# Patient Record
Sex: Female | Born: 1985
Health system: Southern US, Community
[De-identification: ages and names within clinical notes are randomized; demographics above are authoritative.]

## PROBLEM LIST (undated history)

## (undated) DIAGNOSIS — D649 Anemia, unspecified: Secondary | ICD-10-CM

## (undated) HISTORY — PX: TONSILLECTOMY: SUR1361

## (undated) HISTORY — DX: Anemia, unspecified: D64.9

---
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
## 2008-04-07 ENCOUNTER — Emergency Department (HOSPITAL_COMMUNITY): Admission: EM | Admit: 2008-04-07 | Discharge: 2008-04-07 | Payer: Self-pay | Admitting: Emergency Medicine

## 2009-01-26 ENCOUNTER — Emergency Department (HOSPITAL_COMMUNITY): Admission: EM | Admit: 2009-01-26 | Discharge: 2009-01-26 | Payer: Self-pay | Admitting: Emergency Medicine

## 2010-03-31 ENCOUNTER — Other Ambulatory Visit: Admission: RE | Admit: 2010-03-31 | Discharge: 2010-03-31 | Payer: Self-pay | Admitting: Obstetrics and Gynecology

## 2010-08-12 ENCOUNTER — Encounter (HOSPITAL_COMMUNITY)
Admission: RE | Admit: 2010-08-12 | Discharge: 2010-08-12 | Disposition: A | Discharge status: Home / Self Care | Payer: Medicaid Other | Source: Ambulatory Visit | Attending: Obstetrics & Gynecology | Admitting: Obstetrics & Gynecology

## 2010-08-12 DIAGNOSIS — Z01812 Encounter for preprocedural laboratory examination: Secondary | ICD-10-CM | POA: Insufficient documentation

## 2010-08-12 LAB — SURGICAL PCR SCREEN
MRSA, PCR: NEGATIVE
Staphylococcus aureus: NEGATIVE

## 2010-08-12 LAB — URINALYSIS, ROUTINE W REFLEX MICROSCOPIC
Bilirubin Urine: NEGATIVE
Hgb urine dipstick: NEGATIVE
Ketones, ur: NEGATIVE mg/dL
Nitrite: NEGATIVE
Protein, ur: NEGATIVE mg/dL
Specific Gravity, Urine: 1.025 (ref 1.005–1.030)
Urine Glucose, Fasting: NEGATIVE mg/dL
Urobilinogen, UA: 0.2 mg/dL (ref 0.0–1.0)
pH: 6.5 (ref 5.0–8.0)

## 2010-08-12 LAB — CBC
HCT: 36.5 % (ref 36.0–46.0)
Hemoglobin: 12 g/dL (ref 12.0–15.0)
MCH: 30.6 pg (ref 26.0–34.0)
MCHC: 32.9 g/dL (ref 30.0–36.0)
MCV: 93.1 fL (ref 78.0–100.0)
Platelets: 203 10*3/uL (ref 150–400)
RBC: 3.92 MIL/uL (ref 3.87–5.11)
RDW: 13.1 % (ref 11.5–15.5)
WBC: 11.2 10*3/uL — ABNORMAL HIGH (ref 4.0–10.5)

## 2010-08-12 LAB — RPR: RPR Ser Ql: NONREACTIVE

## 2010-08-16 ENCOUNTER — Inpatient Hospital Stay (HOSPITAL_COMMUNITY)
Admission: RE | Admit: 2010-08-16 | Discharge: 2010-08-18 | DRG: 766 | Disposition: A | Discharge status: Home / Self Care | Payer: Medicaid Other | Source: Ambulatory Visit | Attending: Obstetrics & Gynecology | Admitting: Obstetrics & Gynecology

## 2010-08-16 DIAGNOSIS — O34219 Maternal care for unspecified type scar from previous cesarean delivery: Principal | ICD-10-CM | POA: Diagnosis present

## 2010-08-16 LAB — TYPE AND SCREEN
ABO/RH(D): A NEG
Antibody Screen: POSITIVE
DAT, IgG: NEGATIVE

## 2010-08-17 LAB — CBC
HCT: 26.7 % — ABNORMAL LOW (ref 36.0–46.0)
Hemoglobin: 8.9 g/dL — ABNORMAL LOW (ref 12.0–15.0)
MCH: 31 pg (ref 26.0–34.0)
MCHC: 33.3 g/dL (ref 30.0–36.0)
MCV: 93 fL (ref 78.0–100.0)
Platelets: 162 10*3/uL (ref 150–400)
RDW: 13.2 % (ref 11.5–15.5)
WBC: 11.3 10*3/uL — ABNORMAL HIGH (ref 4.0–10.5)

## 2010-08-19 NOTE — Op Note (Addendum)
Beverly Leon, Beverly Leon NO.:  [id number]  MEDICAL RECORD NO.:  [id number]           PATIENT TYPE:  I  LOCATION:  9118                          FACILITY:  [facility]  PHYSICIAN:  Lazaro Arms, M.D.   DATE OF BIRTH:  1986-04-20  DATE OF PROCEDURE:  08/16/2010 DATE OF DISCHARGE:                              OPERATIVE REPORT   PREOPERATIVE DIAGNOSIS:  Repeat cesarean section of a gravida 3, para 2- 0-0-3 at 21 and [redacted] weeks gestation, this is her third cesarean section.  POSTOPERATIVE DIAGNOSIS:  Repeat cesarean section of a gravida 3, para 2- 0-0-3 at 14 and [redacted] weeks gestation, this is her third cesarean section.  PROCEDURE:  Repeat low transverse cesarean section by Pfannenstiel incision.  SURGEONS: 1. Lazaro Arms, MD 2. Lucina Mellow, DO  ANESTHESIA:  Spinal.  FINDINGS:  A viable infant female in the vertex presentation, Apgars of 8 and 9 with normal uterus, tubes, ovaries, and placenta.  Placenta was the only specimen sent to labor and delivery.  ESTIMATED BLOOD LOSS:  850.  IV FLUIDS:  3000.  URINE OUTPUT:  200.  COMPLICATIONS:  None.  INDICATIONS:  This is a 25 year old gravida 3, para 2-0-0-3 at 9 and 2 weeks' gestation with a history of 2 prior C-sections and desired a need for a repeat cesarean section.  FINDINGS:  As above.  PROCEDURE IN DETAIL:  The patient was taken to the operating room where spinal anesthesia was found to be adequate.  She was then prepared and draped in the normal sterile fashion in the dorsal supine position with a leftward tilt.  A Pfannenstiel skin incision was then made with the scalpel and carried through to the underlying fascia with the Bovie. The fascia was incised in the midline and the incision was extended laterally with Mayo scissors.  Superior aspect of the fascial incision was then grasped with the Kocher clamps, elevated, and the underlying rectus muscles were dissected off bluntly.  Attention was then  turned to the inferior aspect of the incision and in similar fashion was grasped, tented up with Kocher clamps and the rectus muscles dissected off bluntly.  The rectus muscles were then separated in the midline.  The peritoneum was identified and entered.  The peritoneal incision was then extended superiorly and inferiorly with traction and good visualization of the bladder.  The bladder blade was inserted and the vesicouterine peritoneum was identified.  A bladder flap was not created.  The lower uterine segment was incised in a transverse fashion with the scalpel.  The uterine incision was then extended with traction.  The bladder blade was removed and the infant's head was delivered atraumatically.  The nose and mouth were suctioned with the bulb suction and the cord was clamped and cut at this time and 2 grams of cefotetan were given.  The infant was handed off to the waiting pediatrician. Cord blood sample was obtained.  The placenta was then removed manually.  The uterus was exteriorized and cleared of all clots and debris.  The uterine incision was repaired with 2-0 Vicryl in a running locked fashion.  The  second layer was not necessary to obtain hemostasis.  The uterus was inspected, normal tubes and ovaries were noted.  The gutters were cleared of all clots and the peritoneum was irrigated and drained.  The peritoneum was closed with 2 interrupted stitches of 0-chromic.  The fascia was reapproximated with 0- Vicryl in a running fashion.  The skin was closed with subcuticulars on a acute needle.  The patient tolerated the procedure well.  Sponge, lap, and needle counts were correct x3.  As noted, the 2 grams of cefotetan were given at the cord clamp.  The patient was taken to the recovery room in stable condition and the infant was in the nursery at the time of this dictation.    ______________________________ Lucina Mellow, DO   ______________________________ Lazaro Arms, M.D.    SH/MEDQ  D:  08/16/2010  T:  08/17/2010  Job:  [id number]  Electronically Signed by Lucina Mellow MD on 09/10/2010 08:15:50 PM Electronically Signed by Duane Lope M.D. on 09/16/2010 07:43:04 AM

## 2010-09-06 NOTE — Discharge Summary (Signed)
  Beverly Leon, KAGEL NO.:  [id number]  MEDICAL RECORD NO.:  [id number]           PATIENT TYPE:  LOCATION:                                 FACILITY:  PHYSICIAN:  Maryelizabeth Kaufmann, MD  DATE OF BIRTH:  September 09, 1985  DATE OF ADMISSION:  08/16/2010 DATE OF DISCHARGE:  08/18/2010                              DISCHARGE SUMMARY   ADMISSION DIAGNOSES: 1. Intrauterine pregnancy at 39 weeks and 3 days, previous C-section     x2.  DISCHARGE DIAGNOSIS: 1. Status post repeat low transverse cesarean section via Pfannenstiel     incision.  ATTENDING PHYSICIAN:  Scheryl Darter, MD.  FELLOW:  Maryelizabeth Kaufmann, MD.  DISCHARGE MEDICATIONS: 1. Ibuprofen 600 mg 1 tab p.o. q.6 h. as needed. 2. Percocet 5/325, 1 tab p.o. q.4 h. as needed. 3. Ferrous sulfate 325, 1 tab p.o. b.i.d. 4. Colace 100 mg 1 tab p.o. b.i.d. 5. The patient did receive Depo-Provera 150 mg IM prior to discharge.  OPERATIONS:  Repeat low transverse cesarean section on August 16, 2010 with delivery of a viable female infant in vertex presentation.  Apgar's were 8 and 9.  Weight was 7 pounds 13 ounces.  PERTINENT LABS:  Initial hemoglobin preop was 12.0 and 36.5. Postoperative hemoglobin was 8.9 and 26.7.  She was otherwise hemodynamically stable and platelets were normal.  HOSPITAL COURSE:  This is a 25 year old gravida 3, para 2-0-0-3 who presented on admission with previous C-section x2 desiring repeat C- section.  The patient underwent a repeat C-section on the 13th.  Her postoperative course was benign.  She was otherwise hemodynamically stable and afebrile.  She is ambulating well without any acute complaints.  She was discharged home on postoperative day #2 in stable condition.  DISPOSITION:  Discharged to home.  DISCHARGE CONDITION:  Stable.  FOLLOWUP:  The patient is to follow up with [facility] in 2 weeks for postoperative wound check and further evaluation.  The patient is to have an  IUD at 6 weeks postpartum check per report.  ER WARNINGS:  The patient is to return to the emergency department with any fevers, chills, nausea, vomiting, any incisional problems such as redness, swelling, discharge, any opening or dehiscence or other concerning symptoms.          ______________________________ Maryelizabeth Kaufmann, MD     LC/MEDQ  D:  08/18/2010  T:  08/18/2010  Job:  [id number]  Electronically Signed by Maryelizabeth Kaufmann MD on 09/06/2010 09:51:05 AM

## 2010-11-19 NOTE — Op Note (Signed)
NAMETEIGHAN, Beverly Leon              ACCOUNT NO.:  [id number]   MEDICAL RECORD NO.:  [id number]          PATIENT TYPE:  INP   LOCATION:  A413                          FACILITY:  [facility]   PHYSICIAN:  Lazaro Arms, M.D.   DATE OF BIRTH:  1986-01-14   DATE OF PROCEDURE:  06/11/2004  DATE OF DISCHARGE:                                 OPERATIVE REPORT   PREOPERATIVE DIAGNOSES:  1.  Intrauterine pregnancy at [redacted] weeks gestation.  2.  Previous Cesarean section.   POSTOPERATIVE DIAGNOSES:  1.  Intrauterine pregnancy at [redacted] weeks gestation.  2.  Previous Cesarean section.   PROCEDURE:  Repeat Cesarean section.   SURGEON:  Dr. Despina Hidden.   ANESTHESIA:  Spinal.   FINDINGS:  Over a low transverse hysterotomy incision was delivered a viable  female infant at 35 with Apgars of 8 and 10 with weight determined in the  nursery. There was three-vessel cord. Cord blood and cord gases were sent.  Placenta was normal and intact. Uterus, tubes, and ovaries were normal.   DESCRIPTION OF OPERATION:  The patient was taken to the operating room,  placed in the sitting position where she underwent a spinal anesthetic. She  was then placed in supine position with a roll under her right hip. She was  prepped and draped in the usual sterile fashion. A Pfannenstiel skin  incision was made and carried sharply to the rectus fascia which was scored  in the midline and extended laterally. The fascia was taken off the muscles  superiorly and inferiorly without difficulty. The muscles were divided.  Peritoneal cavity was entered, and bladder blade was placed. The  vesicouterine serosal flap was created. Low transverse hysterotomy incision  was made, and over this incision was delivered a viable female infant at  57 with Apgars of 8 and 10 with weight determined in the nursery. The  infant was handed to Dr. Milinda Cave who was in attendance for routine neonatal  resuscitation. Cord blood and cord gases were sent. Placenta  was normal and  delivered spontaneously. The uterus was exteriorized and wiped clean with a  clean lap pad. It was closed in 2 layers, first being a running interlocking  layer, the second being imbricating layer. There was good hemostasis. The  uterus was replaced in the peritoneal cavity. The pelvis was irrigated  vigorously and all pedicles hemostatic. The muscles and peritoneum were  reapproximated loosely with 0  chromic, the fascia closed using a 0 Vicryl running. Subcutaneous tissue was  made hemostatic and irrigated. Skin was closed using skin staples. The  patient tolerated the procedure. She experienced 700 cc of blood loss. Was  taken to the recovery room in good, stable condition. All counts correct x3.     Luth   LHE/MEDQ  D:  06/11/2004  T:  06/11/2004  Job:  [id number]

## 2010-11-19 NOTE — Discharge Summary (Signed)
Beverly Leon, Beverly Leon              ACCOUNT NO.:  [id number]   MEDICAL RECORD NO.:  [id number]          PATIENT TYPE:  INP   LOCATION:  A413                          FACILITY:  [facility]   PHYSICIAN:  Tilda Burrow, M.D. DATE OF BIRTH:  07/29/1985   DATE OF ADMISSION:  06/11/2004  DATE OF DISCHARGE:  12/11/2005LH                                 DISCHARGE SUMMARY   ADMISSION DIAGNOSES:  1.  Pregnancy at 39 weeks.  2.  Prior cesarean section, not for trial of labor.   DISCHARGE DIAGNOSES:  1.  Pregnancy at 39 weeks, delivered.  2.  Prior cesarean section not for trial of labor.   PROCEDURE:  On June 11, 2004, repeat low transverse cervical cesarean  section by Dr. Turner Daniels.   DISCHARGE MEDICATIONS:  1.  Darvocet-N 100 #30 tablets one to two q.4h. p.r.n. pain.  2.  Motrin 800 mg one q.8h. p.r.n. cramps.  3.  Depo-Provera 150 mg to be administered at follow up visit at [facility]      OB/GYN.   HOSPITAL COURSE:  This 25 year old female, gravida 2, para 1-0-0-2, (_she  had_twins cesarean section delivered in January 2005), is admitted for  repeat cesarean section.  She has had no problems this pregnancy.   The patient was admitted, underwent repeat low transverse cervical cesarean  section on June 11, 2004, with 750 cc estimated blood loss.  Admission  hemoglobin was 11.1, hematocrit 31.7.  Maternal blood type is A negative.  Repeat cesarean section was uneventful other than the increased blood loss.  Delivered a healthy female infant with Apgar's of 9 and 9.   Postpartum course was straightforward.  Hematocrit returned to 23.5,  hemoglobin 8.3, on postoperative day #1.  She was afebrile, tolerating a  regular diet, tolerating the anemia quite well, incision looked great  without erythema or swelling.  She was stable for discharge, tolerating the  discomfort with minimal pain medication needs.  Followup will be five days  for Depo-Provera and staple removal.  The patient is  considering  progesterone-containing IUD as long-term contraception.     John   JVF/MEDQ  D:  06/13/2004  T:  06/13/2004  Job:  [id number]

## 2010-11-22 ENCOUNTER — Other Ambulatory Visit (HOSPITAL_COMMUNITY)
Admission: RE | Admit: 2010-11-22 | Discharge: 2010-11-22 | Disposition: A | Discharge status: Home / Self Care | Payer: Medicaid Other | Source: Ambulatory Visit | Attending: Obstetrics and Gynecology | Admitting: Obstetrics and Gynecology

## 2010-11-22 DIAGNOSIS — Z01419 Encounter for gynecological examination (general) (routine) without abnormal findings: Secondary | ICD-10-CM | POA: Insufficient documentation

## 2010-11-22 DIAGNOSIS — Z113 Encounter for screening for infections with a predominantly sexual mode of transmission: Secondary | ICD-10-CM | POA: Insufficient documentation

## 2012-10-24 DIAGNOSIS — Z029 Encounter for administrative examinations, unspecified: Secondary | ICD-10-CM

## 2015-04-26 ENCOUNTER — Encounter (HOSPITAL_COMMUNITY): Payer: Self-pay | Admitting: Emergency Medicine

## 2015-04-26 ENCOUNTER — Emergency Department (HOSPITAL_COMMUNITY)
Admission: EM | Admit: 2015-04-26 | Discharge: 2015-04-26 | Discharge status: Against Medical Advice | Payer: Medicaid Other | Attending: Emergency Medicine | Admitting: Emergency Medicine

## 2015-04-26 DIAGNOSIS — Z008 Encounter for other general examination: Secondary | ICD-10-CM | POA: Insufficient documentation

## 2015-04-26 DIAGNOSIS — F17210 Nicotine dependence, cigarettes, uncomplicated: Secondary | ICD-10-CM | POA: Insufficient documentation

## 2015-04-26 LAB — BASIC METABOLIC PANEL
ANION GAP: 9 (ref 5–15)
BUN: 11 mg/dL (ref 6–20)
CO2: 28 mmol/L (ref 22–32)
Calcium: 9.6 mg/dL (ref 8.9–10.3)
Chloride: 104 mmol/L (ref 101–111)
Creatinine, Ser: 0.62 mg/dL (ref 0.44–1.00)
GFR calc Af Amer: >60 mL/min (ref >60)
GFR calc non Af Amer: >60 mL/min (ref >60)
Glucose, Bld: 87 mg/dL (ref 65–99)
Potassium: 4.4 mmol/L (ref 3.5–5.1)
Sodium: 141 mmol/L (ref 135–145)

## 2015-04-26 LAB — RAPID URINE DRUG SCREEN, HOSP PERFORMED
Amphetamines: NOT DETECTED
Barbiturates: NOT DETECTED
Benzodiazepines: POSITIVE — AB
Cocaine: NOT DETECTED
OPIATES: POSITIVE — AB
Tetrahydrocannabinol: NOT DETECTED

## 2015-04-26 LAB — CBC WITH DIFFERENTIAL/PLATELET
BASOS PCT: 0 %
Basophils Absolute: 0 10*3/uL (ref 0.0–0.1)
Eosinophils Absolute: 0.1 10*3/uL (ref 0.0–0.7)
Eosinophils Relative: 2 %
HCT: 42.2 % (ref 36.0–46.0)
Hemoglobin: 14.3 g/dL (ref 12.0–15.0)
Lymphocytes Relative: 22 %
Lymphs Abs: 1.6 10*3/uL (ref 0.7–4.0)
MCH: 30.7 pg (ref 26.0–34.0)
MCHC: 33.9 g/dL (ref 30.0–36.0)
MCV: 90.6 fL (ref 78.0–100.0)
MONOS PCT: 7 %
Monocytes Absolute: 0.5 10*3/uL (ref 0.1–1.0)
Neutro Abs: 5 10*3/uL (ref 1.7–7.7)
Neutrophils Relative %: 69 %
Platelets: 264 10*3/uL (ref 150–400)
RBC: 4.66 MIL/uL (ref 3.87–5.11)
RDW: 13.5 % (ref 11.5–15.5)
WBC: 7.2 10*3/uL (ref 4.0–10.5)

## 2015-04-26 LAB — ETHANOL: Alcohol, Ethyl (B): <5 mg/dL (ref <5)

## 2015-04-26 NOTE — ED Notes (Signed)
Patient states she has been using heroin since November. States she needs help with rehabilitation. Patient complaining of chills and sweating starting this morning. States last use was yesterday.

## 2015-04-26 NOTE — ED Notes (Signed)
Informed by staff that pt and family left AMA

## 2016-05-16 ENCOUNTER — Emergency Department (HOSPITAL_COMMUNITY): Payer: No Typology Code available for payment source

## 2016-05-16 ENCOUNTER — Emergency Department (HOSPITAL_COMMUNITY)
Admission: EM | Admit: 2016-05-16 | Discharge: 2016-05-16 | Disposition: A | Discharge status: Home / Self Care | Payer: No Typology Code available for payment source | Attending: Emergency Medicine | Admitting: Emergency Medicine

## 2016-05-16 ENCOUNTER — Encounter (HOSPITAL_COMMUNITY): Payer: Self-pay | Admitting: Emergency Medicine

## 2016-05-16 DIAGNOSIS — Y939 Activity, unspecified: Secondary | ICD-10-CM | POA: Diagnosis not present

## 2016-05-16 DIAGNOSIS — F17210 Nicotine dependence, cigarettes, uncomplicated: Secondary | ICD-10-CM | POA: Insufficient documentation

## 2016-05-16 DIAGNOSIS — L538 Other specified erythematous conditions: Secondary | ICD-10-CM | POA: Diagnosis not present

## 2016-05-16 DIAGNOSIS — Y990 Civilian activity done for income or pay: Secondary | ICD-10-CM | POA: Diagnosis not present

## 2016-05-16 DIAGNOSIS — R51 Headache: Secondary | ICD-10-CM | POA: Diagnosis not present

## 2016-05-16 DIAGNOSIS — Y92410 Unspecified street and highway as the place of occurrence of the external cause: Secondary | ICD-10-CM | POA: Insufficient documentation

## 2016-05-16 DIAGNOSIS — S4992XA Unspecified injury of left shoulder and upper arm, initial encounter: Secondary | ICD-10-CM | POA: Diagnosis not present
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
## 2016-05-16 NOTE — Discharge Instructions (Signed)
Take tylenol or ibuprofen every 6-8 hours as needed for neck pain and headache Mild stretches, heating may also be helpful Return to ED if you develop severe sudden headache, changes in vision, neck stiffness or pain that do not respond to tylenol or ibuprofen
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
## 2018-03-08 ENCOUNTER — Encounter: Payer: Self-pay | Admitting: Adult Health

## 2018-03-08 ENCOUNTER — Ambulatory Visit (INDEPENDENT_AMBULATORY_CARE_PROVIDER_SITE_OTHER): Payer: BLUE CROSS/BLUE SHIELD | Admitting: Adult Health

## 2018-03-08 ENCOUNTER — Other Ambulatory Visit (HOSPITAL_COMMUNITY)
Admission: RE | Admit: 2018-03-08 | Discharge: 2018-03-08 | Disposition: A | Discharge status: Home / Self Care | Payer: BLUE CROSS/BLUE SHIELD | Source: Ambulatory Visit | Attending: Adult Health | Admitting: Adult Health

## 2018-03-08 VITALS — BP 123/75 | HR 83 | Ht 63.75 in | Wt 151.5 lb

## 2018-03-08 DIAGNOSIS — Z01419 Encounter for gynecological examination (general) (routine) without abnormal findings: Secondary | ICD-10-CM | POA: Diagnosis not present

## 2018-03-08 DIAGNOSIS — Z1151 Encounter for screening for human papillomavirus (HPV): Secondary | ICD-10-CM | POA: Diagnosis not present

## 2018-03-08 DIAGNOSIS — Z113 Encounter for screening for infections with a predominantly sexual mode of transmission: Secondary | ICD-10-CM | POA: Insufficient documentation

## 2018-03-08 DIAGNOSIS — Z3009 Encounter for other general counseling and advice on contraception: Secondary | ICD-10-CM | POA: Insufficient documentation

## 2018-03-08 DIAGNOSIS — Z3202 Encounter for pregnancy test, result negative: Secondary | ICD-10-CM | POA: Insufficient documentation

## 2018-03-08 LAB — POCT URINE PREGNANCY: Preg Test, Ur: NEGATIVE
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
## 2018-03-09 LAB — CYTOLOGY - PAP
Adequacy: ABSENT
Chlamydia: NEGATIVE
Diagnosis: NEGATIVE
HPV: NOT DETECTED
Neisseria Gonorrhea: NEGATIVE

## 2018-03-09 LAB — HEPATITIS C ANTIBODY: Hep C Virus Ab: <0.1 s/co ratio (ref 0.0–0.9)

## 2018-03-09 LAB — RPR: RPR Ser Ql: NONREACTIVE

## 2018-03-09 LAB — HIV ANTIBODY (ROUTINE TESTING W REFLEX): HIV Screen 4th Generation wRfx: NONREACTIVE

## 2018-03-26 ENCOUNTER — Encounter: Payer: Self-pay | Admitting: Adult Health

## 2018-03-26 ENCOUNTER — Ambulatory Visit (INDEPENDENT_AMBULATORY_CARE_PROVIDER_SITE_OTHER): Payer: BLUE CROSS/BLUE SHIELD | Admitting: Adult Health

## 2018-03-26 VITALS — BP 107/62 | HR 75 | Ht 65.0 in | Wt 152.0 lb

## 2018-03-26 DIAGNOSIS — Z3046 Encounter for surveillance of implantable subdermal contraceptive: Secondary | ICD-10-CM

## 2018-03-26 DIAGNOSIS — Z3202 Encounter for pregnancy test, result negative: Secondary | ICD-10-CM | POA: Diagnosis not present

## 2018-03-26 DIAGNOSIS — Z30017 Encounter for initial prescription of implantable subdermal contraceptive: Secondary | ICD-10-CM

## 2018-03-26 LAB — POCT URINE PREGNANCY: Preg Test, Ur: NEGATIVE

## 2018-03-26 MED ORDER — ETONOGESTREL 68 MG ~~LOC~~ IMPL
68.0000 mg | DRUG_IMPLANT | Freq: Once | SUBCUTANEOUS | Status: AC
  Administered 2018-03-26: 68 mg via SUBCUTANEOUS

## 2018-03-26 NOTE — Patient Instructions (Signed)
Use condoms x 2 weeks, keep clean and dry x 24 hours, no heavy lifting, keep steri strips on x 72 hours, Keep pressure dressing on x 24 hours. Follow up prn problems.  

## 2018-03-26 NOTE — Progress Notes (Signed)
  Subjective:     Patient ID: Beverly Leon, female   DOB: 07/07/1985, 32 y.o.   MRN: [id number]  HPI Beverly Leon is a 32 year old white female in for nexplanon insertion.  Review of Systems For nexplanon insertion Reviewed past medical,surgical, social and family history. Reviewed medications and allergies.     Objective:   Physical Exam BP 107/62 (BP Location: Right Arm, Patient Position: Sitting, Cuff Size: Normal)   Pulse 75   Ht 5\' 5"  (1.651 m)   Wt 152 lb (68.9 kg)   LMP 03/21/2018   BMI 25.29 kg/m UPT negative. Consent signed, time out called. Left arm cleansed with betadine, and injected with 1.5 cc 1% lidocaine and waited til numb. Nexplanon easily inserted and steri strips applied.Rod easily palpated by provider and pt. Pressure dressing applied.    Assessment:     1. Nexplanon insertion   2. Pregnancy examination or test, negative result   LOT [id number] Exp 05/15/20    Plan:     Use condoms x 2 weeks, keep clean and dry x 24 hours, no heavy lifting, keep steri strips on x 72 hours, Keep pressure dressing on x 24 hours. Follow up prn problems.Removal in 3 years or sooner if desired.

## 2022-06-15 ENCOUNTER — Ambulatory Visit: Payer: 59 | Admitting: Obstetrics & Gynecology

## 2022-10-19 ENCOUNTER — Other Ambulatory Visit (HOSPITAL_COMMUNITY)
Admission: RE | Admit: 2022-10-19 | Discharge: 2022-10-19 | Disposition: A | Discharge status: Home / Self Care | Payer: BC Managed Care – PPO | Source: Ambulatory Visit | Attending: Adult Health | Admitting: Adult Health

## 2022-10-19 ENCOUNTER — Ambulatory Visit: Payer: BC Managed Care – PPO | Admitting: Adult Health

## 2022-10-19 ENCOUNTER — Encounter: Payer: Self-pay | Admitting: Adult Health

## 2022-10-19 VITALS — BP 130/84 | HR 74 | Ht 66.0 in | Wt 154.5 lb

## 2022-10-19 DIAGNOSIS — B9689 Other specified bacterial agents as the cause of diseases classified elsewhere: Secondary | ICD-10-CM | POA: Diagnosis not present

## 2022-10-19 DIAGNOSIS — Z01419 Encounter for gynecological examination (general) (routine) without abnormal findings: Secondary | ICD-10-CM

## 2022-10-19 DIAGNOSIS — K5900 Constipation, unspecified: Secondary | ICD-10-CM | POA: Insufficient documentation

## 2022-10-19 DIAGNOSIS — N898 Other specified noninflammatory disorders of vagina: Secondary | ICD-10-CM | POA: Diagnosis not present

## 2022-10-19 DIAGNOSIS — K649 Unspecified hemorrhoids: Secondary | ICD-10-CM | POA: Diagnosis not present

## 2022-10-19 DIAGNOSIS — Z9189 Other specified personal risk factors, not elsewhere classified: Secondary | ICD-10-CM | POA: Insufficient documentation

## 2022-10-19 DIAGNOSIS — Z1329 Encounter for screening for other suspected endocrine disorder: Secondary | ICD-10-CM | POA: Insufficient documentation

## 2022-10-19 DIAGNOSIS — Z13220 Encounter for screening for lipoid disorders: Secondary | ICD-10-CM | POA: Insufficient documentation

## 2022-10-19 LAB — POCT WET PREP (WET MOUNT)
Clue Cells Wet Prep Whiff POC: POSITIVE
WBC, Wet Prep HPF POC: POSITIVE

## 2022-10-19 MED ORDER — METRONIDAZOLE 500 MG PO TABS: 500.0000 mg | ORAL_TABLET | Freq: Two times a day (BID) | ORAL | 0 refills | Status: DC

## 2022-10-19 NOTE — Progress Notes (Signed)
Patient ID: Beverly Leon, female   DOB: June 03, 1986, 37 y.o.   MRN: [id number] History of Present Illness: Beverly Leon is a 37 year old white female, single, [id number], in for a well woman gyn exam and pap. She has an expired nexplanon in place, too.    Current Medications, Allergies, Past Medical History, Past Surgical History, Family History and Social History were reviewed in [employer] record.     Review of Systems: Patient denies any headaches, hearing loss, fatigue, blurred vision, shortness of breath, chest pain, abdominal pain, problems with urination, or intercourse. No joint pain or mood swings.  Has constipation at times and has hemorrhoid. She is working at [employer], and goes to gym but lacks motivation at home   Physical Exam:BP 130/84 (BP Location: Left Arm, Patient Position: Sitting, Cuff Size: Normal)   Pulse 74   Ht  (1.676 m)   Wt 154 lb 8 oz (70.1 kg)   LMP 09/28/2022 (Approximate)   BMI 24.94 kg/m   General:  Well developed, well nourished, no acute distress Skin:  Warm and dry Neck:  Midline trachea, normal thyroid, good ROM, no lymphadenopathy Lungs; Clear to auscultation bilaterally Breast:  No dominant palpable mass, retraction, or nipple discharge Cardiovascular: Regular rate and rhythm Abdomen:  Soft, non tender, no hepatosplenomegaly Pelvic:  External genitalia is normal in appearance, no lesions.  The vagina is normal in appearance,has creany white discharge with odor. Urethra has no lesions or masses. The cervix is smooth, pap with GC/CHL and HR HPV genotyping performed.   Uterus is felt to be normal size, shape, and contour.  No adnexal masses or tenderness noted.Bladder is non tender, no masses felt.  Wet Prep was +WBC and clue cells Has external hemorrhoid  Extremities/musculoskeletal:  No swelling or varicosities noted, no clubbing or cyanosis Psych:  No mood changes, alert and cooperative,seems happy AA is 3 Fall risk is  low    10/19/2022    9:31 AM 03/08/2018    9:31 AM  Depression screen PHQ 2/9  Decreased Interest 0 0  Down, Depressed, Hopeless 0 0  PHQ - 2 Score 0 0  Altered sleeping 0   Tired, decreased energy 1   Change in appetite 0   Feeling bad or failure about yourself  1   Trouble concentrating 1   Moving slowly or fidgety/restless 0   Suicidal thoughts 0   PHQ-9 Score 3        10/19/2022    9:32 AM  GAD 7 : Generalized Anxiety Score  Nervous, Anxious, on Edge 0  Control/stop worrying 0  Worry too much - different things 0  Trouble relaxing 0  Restless 0  Easily annoyed or irritable 1  Afraid - awful might happen 0  Total GAD 7 Score 1      Upstream - 10/19/22 0914       Pregnancy Intention Screening   Does the patient want to become pregnant in the next year? No    Does the patient's partner want to become pregnant in the next year? No    Would the patient like to discuss contraceptive options today? No      Contraception Wrap Up   Current Method Female Condom    End Method Female Condom            Examination chaperoned by Gladys Damme NP student and she did a co exam.   Impression and Plan: 1. Encounter for gynecological examination with Papanicolaou smear  of cervix Pap sent Pap in 3 years if normal Will check labs  - Cytology - PAP( Beverly Leon) - CBC - Comprehensive metabolic panel - Lipid panel  Return in 2 weeks for nexplanon removal  2. Vaginal discharge +discharge with odor  3. Vaginal odor + odor  4. Hemorrhoids, unspecified hemorrhoid type Try preparation H or Anusol   5. Constipation, unspecified constipation type Can take colace or senokot, but also white grape juice or apple juice, grapes, apples with peeling, prune juice and increase water   6. Screening cholesterol level - Lipid panel  7. Screening for thyroid disorder - TSH + free T4  8. Lack of motivation  - TSH + free T4  9. BV (bacterial vaginosis) Will rx flagyl,no sex or  alcohol while taking Meds ordered this encounter  Medications   metroNIDAZOLE (FLAGYL) 500 MG tablet    Sig: Take 1 tablet (500 mg total) by mouth 2 (two) times daily.    Dispense:  14 tablet    Refill:  0    Order Specific Question:   Supervising Provider    Answer:   Duane Lope H [[id number]]

## 2022-10-26 LAB — CYTOLOGY - PAP
Chlamydia: NEGATIVE
Comment: NEGATIVE
Comment: NEGATIVE
Comment: NORMAL
Diagnosis: NEGATIVE
High risk HPV: NEGATIVE
Neisseria Gonorrhea: NEGATIVE

## 2022-11-02 ENCOUNTER — Encounter: Payer: Self-pay | Admitting: Adult Health

## 2022-11-02 ENCOUNTER — Ambulatory Visit: Payer: BC Managed Care – PPO | Admitting: Adult Health

## 2022-11-02 VITALS — BP 121/74 | HR 81 | Ht 66.0 in | Wt 154.0 lb

## 2022-11-02 DIAGNOSIS — Z3046 Encounter for surveillance of implantable subdermal contraceptive: Secondary | ICD-10-CM | POA: Diagnosis not present

## 2022-11-02 LAB — LIPID PANEL
Chol/HDL Ratio: 2.4 ratio (ref 0.0–4.4)
Cholesterol, Total: 161 mg/dL (ref 100–199)
HDL: 67 mg/dL (ref >39)
LDL Chol Calc (NIH): 84 mg/dL (ref 0–99)
Triglycerides: 49 mg/dL (ref 0–149)
VLDL Cholesterol Cal: 10 mg/dL (ref 5–40)

## 2022-11-02 LAB — CBC
Hematocrit: 35.8 % (ref 34.0–46.6)
Hemoglobin: 11.5 g/dL (ref 11.1–15.9)
MCH: 29 pg (ref 26.6–33.0)
MCHC: 32.1 g/dL (ref 31.5–35.7)
MCV: 90 fL (ref 79–97)
Platelets: 278 10*3/uL (ref 150–450)
RBC: 3.97 x10E6/uL (ref 3.77–5.28)
RDW: 13.5 % (ref 11.7–15.4)
WBC: 5.6 10*3/uL (ref 3.4–10.8)

## 2022-11-02 LAB — COMPREHENSIVE METABOLIC PANEL
ALT: 13 IU/L (ref 0–32)
AST: 17 IU/L (ref 0–40)
Albumin/Globulin Ratio: 1.6 (ref 1.2–2.2)
Albumin: 4.1 g/dL (ref 3.9–4.9)
Alkaline Phosphatase: 35 IU/L — ABNORMAL LOW (ref 44–121)
BUN/Creatinine Ratio: 23 (ref 9–23)
BUN: 14 mg/dL (ref 6–20)
Bilirubin Total: 0.3 mg/dL (ref 0.0–1.2)
CO2: 22 mmol/L (ref 20–29)
Calcium: 9.3 mg/dL (ref 8.7–10.2)
Chloride: 103 mmol/L (ref 96–106)
Creatinine, Ser: 0.62 mg/dL (ref 0.57–1.00)
Globulin, Total: 2.5 g/dL (ref 1.5–4.5)
Glucose: 86 mg/dL (ref 70–99)
Potassium: 4.8 mmol/L (ref 3.5–5.2)
Sodium: 139 mmol/L (ref 134–144)
Total Protein: 6.6 g/dL (ref 6.0–8.5)
eGFR: 118 mL/min/{1.73_m2} (ref >59)

## 2022-11-02 LAB — TSH+FREE T4
Free T4: 1.42 ng/dL (ref 0.82–1.77)
TSH: 1.41 u[IU]/mL (ref 0.450–4.500)

## 2022-11-02 NOTE — Patient Instructions (Signed)
Use condoms, keep clean and dry x 24 hours, no heavy lifting, keep steri strips on x 72 hours, Keep pressure dressing on x 24 hours. Follow up prn problems.  

## 2022-11-02 NOTE — Progress Notes (Signed)
  Subjective:     Patient ID: Beverly Leon, female   DOB: April 26, 1986, 37 y.o.   MRN: [id number]  HPI Beverly Leon is a 37 year old white female, single,[id number], in for nexplanon removal.      Component Value Date/Time   DIAGPAP  10/19/2022 0915    - Negative for intraepithelial lesion or malignancy (NILM)   DIAGPAP  03/08/2018 0000    NEGATIVE FOR INTRAEPITHELIAL LESIONS OR MALIGNANCY.   HPVHIGH Negative 10/19/2022 0915   ADEQPAP  10/19/2022 0915    Satisfactory for evaluation; transformation zone component PRESENT.   ADEQPAP  03/08/2018 0000    Satisfactory for evaluation  endocervical/transformation zone component ABSENT.    Review of Systems For nexplanon removal Reviewed past medical,surgical, social and family history. Reviewed medications and allergies.     Objective:   Physical Exam BP 121/74 (BP Location: Left Arm, Patient Position: Sitting, Cuff Size: Normal)   Pulse 81   Ht 5\' 6"  (1.676 m)   Wt 154 lb (69.9 kg)   LMP 10/22/2022 (Approximate)   BMI 24.86 kg/m   Consent signed and time out called.   Left arm cleansed with betadine, and injected with 1.5 cc 2% lidocaine and waited til numb.Under sterile technique a #11 blade was used to make small vertical incision, and a curved forceps was used to easily remove rod. Steri strips applied. Pressure dressing applied.    Upstream - 11/02/22 1114       Pregnancy Intention Screening   Does the patient want to become pregnant in the next year? No    Does the patient's partner want to become pregnant in the next year? No    Would the patient like to discuss contraceptive options today? No      Contraception Wrap Up   Current Method Abstinence    End Method Abstinence;Female Condom    Contraception Counseling Provided Yes    How was the end contraceptive method provided? N/A             Assessment:     1. Encounter for Nexplanon removal Use condoms, keep clean and dry x 24 hours, no heavy lifting, keep steri strips  on x 72 hours, Keep pressure dressing on x 24 hours. Follow up prn problems.     Plan:     Follow up prn

## 2023-02-08 ENCOUNTER — Ambulatory Visit (INDEPENDENT_AMBULATORY_CARE_PROVIDER_SITE_OTHER): Payer: BLUE CROSS/BLUE SHIELD | Admitting: *Deleted

## 2023-02-08 VITALS — BP 112/75 | HR 72 | Ht 64.0 in | Wt 164.0 lb

## 2023-02-08 DIAGNOSIS — Z3201 Encounter for pregnancy test, result positive: Secondary | ICD-10-CM | POA: Diagnosis not present

## 2023-02-08 DIAGNOSIS — Z789 Other specified health status: Secondary | ICD-10-CM

## 2023-02-08 LAB — POCT URINE PREGNANCY: Preg Test, Ur: POSITIVE — AB

## 2023-02-08 NOTE — Progress Notes (Signed)
   NURSE VISIT- PREGNANCY CONFIRMATION   SUBJECTIVE:  Beverly Leon is a 37 y.o. [phone] female by uncertain LMP of No LMP recorded (lmp unknown). Patient is pregnant. Had spotting 5/24 and 5/25. No bleeding in June or July. Here for pregnancy confirmation.  Home pregnancy test: positive x 2   She reports nausea.  She is taking prenatal vitamins.    OBJECTIVE:  BP 112/75 (BP Location: Left Arm, Patient Position: Sitting, Cuff Size: Normal)   Pulse 72   Ht 5\' 4"  (1.626 m)   Wt 164 lb (74.4 kg)   LMP  (LMP Unknown)   BMI 28.15 kg/m   Appears well, in no apparent distress  Results for orders placed or performed in visit on 02/08/23 (from the past 24 hour(s))  POCT urine pregnancy   Collection Time: 02/08/23  3:12 PM  Result Value Ref Range   Preg Test, Ur Positive (A) Negative    ASSESSMENT: Positive pregnancy test, Unknown by LMP    PLAN: HCG today Schedule for dating ultrasound in TBD based on HCG  Prenatal vitamins: continue   Nausea medicines: not currently needed   OB packet given: Yes  Jobe Marker  02/08/2023 3:13 PM

## 2023-02-22 ENCOUNTER — Other Ambulatory Visit: Payer: Self-pay

## 2023-02-22 ENCOUNTER — Other Ambulatory Visit: Payer: Self-pay | Admitting: General Practice

## 2023-02-22 ENCOUNTER — Ambulatory Visit (INDEPENDENT_AMBULATORY_CARE_PROVIDER_SITE_OTHER): Payer: BLUE CROSS/BLUE SHIELD

## 2023-02-22 DIAGNOSIS — Z3491 Encounter for supervision of normal pregnancy, unspecified, first trimester: Secondary | ICD-10-CM

## 2023-02-22 DIAGNOSIS — Z3A10 10 weeks gestation of pregnancy: Secondary | ICD-10-CM | POA: Diagnosis not present

## 2023-03-28 ENCOUNTER — Encounter: Payer: Self-pay | Admitting: Women's Health

## 2023-03-28 ENCOUNTER — Encounter: Payer: Medicaid Other | Admitting: *Deleted

## 2023-03-28 ENCOUNTER — Ambulatory Visit (INDEPENDENT_AMBULATORY_CARE_PROVIDER_SITE_OTHER): Payer: Medicaid Other | Admitting: Women's Health

## 2023-03-28 VITALS — BP 112/74 | HR 74 | Wt 167.0 lb

## 2023-03-28 DIAGNOSIS — Z1332 Encounter for screening for maternal depression: Secondary | ICD-10-CM

## 2023-03-28 DIAGNOSIS — Z363 Encounter for antenatal screening for malformations: Secondary | ICD-10-CM | POA: Diagnosis not present

## 2023-03-28 DIAGNOSIS — F17200 Nicotine dependence, unspecified, uncomplicated: Secondary | ICD-10-CM

## 2023-03-28 DIAGNOSIS — Z3482 Encounter for supervision of other normal pregnancy, second trimester: Secondary | ICD-10-CM

## 2023-03-28 DIAGNOSIS — O0932 Supervision of pregnancy with insufficient antenatal care, second trimester: Secondary | ICD-10-CM

## 2023-03-28 DIAGNOSIS — Z3A15 15 weeks gestation of pregnancy: Secondary | ICD-10-CM

## 2023-03-28 DIAGNOSIS — Z98891 History of uterine scar from previous surgery: Secondary | ICD-10-CM | POA: Diagnosis not present

## 2023-03-28 DIAGNOSIS — Z3480 Encounter for supervision of other normal pregnancy, unspecified trimester: Secondary | ICD-10-CM

## 2023-03-28 DIAGNOSIS — Z3490 Encounter for supervision of normal pregnancy, unspecified, unspecified trimester: Secondary | ICD-10-CM | POA: Insufficient documentation

## 2023-03-28 DIAGNOSIS — F1191 Opioid use, unspecified, in remission: Secondary | ICD-10-CM | POA: Insufficient documentation

## 2023-03-28 MED ORDER — BLOOD PRESSURE MONITOR MISC
0 refills | Status: DC
Start: 2023-03-28 — End: 2023-09-14

## 2023-03-28 NOTE — Patient Instructions (Addendum)
Beverly Leon, thank you for choosing our office today! We appreciate the opportunity to meet your healthcare needs. You may receive a short survey by mail, e-mail, or through [employer]. If you are happy with your care we would appreciate if you could take just a few minutes to complete the survey questions. We read all of your comments and take your feedback very seriously. Thank you again for choosing our office.  Center for [employer] [facility] Team at [facility]  [facility] at [facility] ([address] [place], [place] [address]) Entrance C, located off of [address] Free 24/7 valet parking   Nausea & Vomiting Have saltine crackers or pretzels by your bed and eat a few bites before you raise your head out of bed in the morning Eat small frequent meals throughout the day instead of large meals Drink plenty of fluids throughout the day to stay hydrated, just don't drink a lot of fluids with your meals.  This can make your stomach fill up faster making you feel sick Do not brush your teeth right after you eat Products with real ginger are good for nausea, like ginger ale and ginger hard candy Make sure it says made with real ginger! Sucking on sour candy like lemon heads is also good for nausea If your prenatal vitamins make you nauseated, take them at night so you will sleep through the nausea Sea Bands If you feel like you need medicine for the nausea & vomiting please let us know If you are unable to keep any fluids or food down please let us know   Constipation Drink plenty of fluid, preferably water, throughout the day Eat foods high in fiber such as fruits, vegetables, and grains Exercise, such as walking, is a good way to keep your bowels regular Drink warm fluids, especially warm prune juice, or decaf coffee Eat a 1/2 cup of real oatmeal (not instant), 1/2 cup applesauce, and 1/2-1 cup warm prune juice every day If needed, you may take Colace (docusate sodium) stool softener  once or twice a day to help keep the stool soft.  If you still are having problems with constipation, you may take Miralax once daily as needed to help keep your bowels regular.   Home Blood Pressure Monitoring for Patients   Your provider has recommended that you check your blood pressure (BP) at least once a week at home. If you do not have a blood pressure cuff at home, one will be provided for you. Contact your provider if you have not received your monitor within 1 week.   Helpful Tips for Accurate Home Blood Pressure Checks  Don't smoke, exercise, or drink caffeine 30 minutes before checking your BP Use the restroom before checking your BP (a full bladder can raise your pressure) Relax in a comfortable upright chair Feet on the ground Left arm resting comfortably on a flat surface at the level of your heart Legs uncrossed Back supported Sit quietly and don't talk Place the cuff on your bare arm Adjust snuggly, so that only two fingertips can fit between your skin and the top of the cuff Check 2 readings separated by at least one minute Keep a log of your BP readings For a visual, please reference this diagram: http://ccnc.care/bpdiagram  Provider Name: [facility]     Phone: [phone]  Zone 1: ALL CLEAR  Continue to monitor your symptoms:  BP reading is less than 140 (top number) or less than 90 (bottom  number)  No right upper stomach pain No headaches or seeing spots No feeling nauseated or throwing up No swelling in face and hands  Zone 2: CAUTION Call your doctor's office for any of the following:  BP reading is greater than 140 (top number) or greater than 90 (bottom number)  Stomach pain under your ribs in the middle or right side Headaches or seeing spots Feeling nauseated or throwing up Swelling in face and hands  Zone 3: EMERGENCY  Seek immediate medical care if you have any of the following:  BP reading is greater than160 (top number) or greater than  110 (bottom number) Severe headaches not improving with Tylenol Serious difficulty catching your breath Any worsening symptoms from Zone 2   Second Trimester of Pregnancy  The second trimester of pregnancy is from week 13 through week 27. This is months 4 through 6 of pregnancy. The second trimester is often a time when you feel your best. Your body has adjusted to being pregnant, and you begin to feel better physically. During the second trimester: Morning sickness has lessened or stopped completely. You may have more energy. You may have an increase in appetite. The second trimester is also a time when the unborn baby (fetus) is growing rapidly. At the end of the sixth month, the fetus may be up to 12 inches long and weigh about 1 pounds. You will likely begin to feel the baby move (quickening) between 16 and 20 weeks of pregnancy. Body changes during your second trimester Your body continues to go through many changes during your second trimester. The changes vary and generally return to normal after the baby is born. Physical changes Your weight will continue to increase. You will notice your lower abdomen bulging out. You may begin to get stretch marks on your hips, abdomen, and breasts. Your breasts will continue to grow and to become tender. Dark spots or blotches (chloasma or mask of pregnancy) may develop on your face. A dark line from your belly button to the pubic area (linea nigra) may appear. You may have changes in your hair. These can include thickening of your hair, rapid growth, and changes in texture. Some people also have hair loss during or after pregnancy, or hair that feels dry or thin. Health changes You may develop headaches. You may have heartburn. You may develop constipation. You may develop hemorrhoids or swollen, bulging veins (varicose veins). Your gums may bleed and may be sensitive to brushing and flossing. You may urinate more often because the fetus is  pressing on your bladder. You may have back pain. This is caused by: Weight gain. Pregnancy hormones that are relaxing the joints in your pelvis. A shift in weight and the muscles that support your balance. Follow these instructions at home: Medicines Follow your health care provider's instructions regarding medicine use. Specific medicines may be either safe or unsafe to take during pregnancy. Do not take any medicines unless approved by your health care provider. Take a prenatal vitamin that contains at least 600 micrograms (mcg) of folic acid. Eating and drinking Eat a healthy diet that includes fresh fruits and vegetables, whole grains, good sources of protein such as meat, eggs, or tofu, and low-fat dairy products. Avoid raw meat and unpasteurized juice, milk, and cheese. These carry germs that can harm you and your baby. You may need to take these actions to prevent or treat constipation: Drink enough fluid to keep your urine pale yellow. Eat foods that are high  in fiber, such as beans, whole grains, and fresh fruits and vegetables. Limit foods that are high in fat and processed sugars, such as fried or sweet foods. Activity Exercise only as directed by your health care provider. Most people can continue their usual exercise routine during pregnancy. Try to exercise for 30 minutes at least 5 days a week. Stop exercising if you develop contractions in your uterus. Stop exercising if you develop pain or cramping in the lower abdomen or lower back. Avoid exercising if it is very hot or humid or if you are at a high altitude. Avoid heavy lifting. If you choose to, you may have sex unless your health care provider tells you not to. Relieving pain and discomfort Wear a supportive bra to prevent discomfort from breast tenderness. Take warm sitz baths to soothe any pain or discomfort caused by hemorrhoids. Use hemorrhoid cream if your health care provider approves. Rest with your legs raised  (elevated) if you have leg cramps or low back pain. If you develop varicose veins: Wear support hose as told by your health care provider. Elevate your feet for 15 minutes, 3-4 times a day. Limit salt in your diet. Safety Wear your seat belt at all times when driving or riding in a car. Talk with your health care provider if someone is verbally or physically abusive to you. Lifestyle Do not use hot tubs, steam rooms, or saunas. Do not douche. Do not use tampons or scented sanitary pads. Avoid cat litter boxes and soil used by cats. These carry germs that can cause birth defects in the baby and possibly loss of the fetus by miscarriage or stillbirth. Do not use herbal remedies, alcohol, illegal drugs, or medicines that are not approved by your health care provider. Chemicals in these products can harm your baby. Do not use any products that contain nicotine or tobacco, such as cigarettes, e-cigarettes, and chewing tobacco. If you need help quitting, ask your health care provider. General instructions During a routine prenatal visit, your health care provider will do a physical exam and other tests. He or she will also discuss your overall health. Keep all follow-up visits. This is important. Ask your health care provider for a referral to a local prenatal education class. Ask for help if you have counseling or nutritional needs during pregnancy. Your health care provider can offer advice or refer you to specialists for help with various needs. Where to find more information American Pregnancy Association: americanpregnancy.org [employer] of Obstetricians and Gynecologists: https://www.todd-brady.net/ Office on [employer] Health: MightyReward.co.nz Contact a health care provider if you have: A headache that does not go away when you take medicine. Vision changes or you see spots in front of your eyes. Mild pelvic cramps, pelvic pressure, or nagging pain in the abdominal  area. Persistent nausea, vomiting, or diarrhea. A bad-smelling vaginal discharge or foul-smelling urine. Pain when you urinate. Sudden or extreme swelling of your face, hands, ankles, feet, or legs. A fever. Get help right away if you: Have fluid leaking from your vagina. Have spotting or bleeding from your vagina. Have severe abdominal cramping or pain. Have difficulty breathing. Have chest pain. Have fainting spells. Have not felt your baby move for the time period told by your health care provider. Have new or increased pain, swelling, or redness in an arm or leg. Summary The second trimester of pregnancy is from week 13 through week 27 (months 4 through 6). Do not use herbal remedies, alcohol, illegal drugs, or medicines  that are not approved by your health care provider. Chemicals in these products can harm your baby. Exercise only as directed by your health care provider. Most people can continue their usual exercise routine during pregnancy. Keep all follow-up visits. This is important. This information is not intended to replace advice given to you by your health care provider. Make sure you discuss any questions you have with your health care provider. Document Revised: 11/27/2019 Document Reviewed: 10/03/2019 Elsevier Patient Education  2024 Elsevier Inc.  For Headaches:  Stay well hydrated, drink enough water so that your urine is clear, sometimes if you are dehydrated you can get headaches Eat small frequent meals and snacks, sometimes if you are hungry you can get headaches Sometimes you get headaches during pregnancy from the pregnancy hormones You can try tylenol (1-2 regular strength 325mg  or 1-2 extra strength 500mg ) as directed on the box. The least amount of medication that works is best.  Cool compresses (cool wet washcloth or ice pack) to area of head that is hurting You can also try drinking a caffeinated drink to see if this will help If not helping, try  below:  For Prevention of Headaches/Migraines: CoQ10 100mg  three times daily Vitamin B2 400mg  daily Magnesium Oxide 400-600mg  daily  Foods to alleviate migraines:  1) dark leafy greens 2) avocado 3) tuna 4) salmon  5) beans and legumes  Foods to avoid: 1) Excessive (or irregular timing) coffee 3) aged cheeses 4) chocolate 5) citrus fruits 6) aspartame and other artifical sweeteners 7) yeast 8) MSG (in processed foods) 9) processed and cured meats 10) nuts and certain seeds 11) chicken livers and other organ meats 12) dairy products like buttermilk, sour cream, and yogurt 13) dried fruits like dates, figs, and raisins 14) garlic 15) onions 16) potato chips 17) pickled foods like olives and sauerkraut 18) some fresh fruits like ripe banana, papaya, red plums, raspberries, kiwi, pineapple 19) tomato-based products  Recommend to keep a migraine diary: rate daily the severity of your headache (1-10) and what foods you eat that day to help determine patterns.   If You Get a Bad Headache/Migraine: Benadryl 25mg   Magnesium Oxide 1 large Gatorade 2 extra strength Tylenol (1,000mg  total) 1 cup coffee or Coke      If this doesn't help please call us @ [phone]

## 2023-03-28 NOTE — Progress Notes (Signed)
INITIAL OBSTETRICAL VISIT Patient name: Beverly Leon MRN [id number]  Date of birth: [birth year] Chief Complaint:   Initial Prenatal Visit (Blurry vision at times)  History of Present Illness:   Beverly Leon is a 37 y.o. [id number] Caucasian female at [redacted]w[redacted]d by [name] at 10 weeks with an Estimated Date of Delivery: 09/14/23 being seen today for her initial obstetrical visit.   No LMP recorded. Patient is pregnant. Her obstetrical history is significant for  c/s x3 (1st twins, then repeat) .   Today she reports  occ blurred vision at work x (looking at screen)  and headaches. Hasn't been to eye doctor in awhile.  H/o heroin use after pain pills w/ last c/s, concerned about taking pain meds post op this time.  Last pap 10/19/22. Results were: NILM w/ HRHPV negative     03/28/2023   10:06 AM 10/19/2022    9:31 AM 03/08/2018    9:31 AM  Depression screen PHQ 2/9  Decreased Interest 1 0 0  Down, Depressed, Hopeless 0 0 0  PHQ - 2 Score 1 0 0  Altered sleeping 2 0   Tired, decreased energy 2 1   Change in appetite 0 0   Feeling bad or failure about yourself  0 1   Trouble concentrating 2 1   Moving slowly or fidgety/restless 0 0   Suicidal thoughts 0 0   PHQ-9 Score 7 3         03/28/2023   10:07 AM 10/19/2022    9:32 AM  GAD 7 : Generalized Anxiety Score  Nervous, Anxious, on Edge 0 0  Control/stop worrying 0 0  Worry too much - different things 0 0  Trouble relaxing 0 0  Restless 1 0  Easily annoyed or irritable 1 1  Afraid - awful might happen 0 0  Total GAD 7 Score 2 1     Review of Systems:   Pertinent items are noted in HPI Denies cramping/contractions, leakage of fluid, vaginal bleeding, abnormal vaginal discharge w/ itching/odor/irritation, headaches, visual changes, shortness of breath, chest pain, abdominal pain, severe nausea/vomiting, or problems with urination or bowel movements unless otherwise stated above.  Pertinent History Reviewed:  Reviewed past  medical,surgical, social, obstetrical and family history.  Reviewed problem list, medications and allergies. OB History  Gravida Para Term Preterm AB Living  4 3 3     4   SAB IAB Ectopic Multiple Live Births        1 4    # Outcome Date GA Lbr Len/2nd Weight Sex Type Anes PTL Lv  4 Current           3 Term 08/16/10 [redacted]w[redacted]d  7 lb 4 oz (3.289 kg) F CS-LTranv Spinal N LIV  2 Term 06/11/04 [redacted]w[redacted]d  6 lb 8 oz (2.948 kg) F CS-LTranv Spinal N LIV  1A Term 07/08/03 [redacted]w[redacted]d  5 lb 6 oz (2.438 kg) F CS-LTranv Spinal N LIV  1B Term 07/08/03 [redacted]w[redacted]d  6 lb 3 oz (2.807 kg) F CS-LTranv Spinal N LIV   Physical Assessment:   Vitals:   03/28/23 0943  BP: 112/74  Pulse: 74  Weight: 167 lb (75.8 kg)  Body mass index is 28.67 kg/m.       Physical Examination:  General appearance - well appearing, and in no distress  Mental status - alert, oriented to person, place, and time  Psych:  She has a normal mood and affect  Skin - warm and dry,  normal color, no suspicious lesions noted  Chest - effort normal, all lung fields clear to auscultation bilaterally  Heart - normal rate and regular rhythm  Abdomen - soft, nontender  Extremities:  No swelling or varicosities noted  Thin prep pap is not done   Chaperone: N/A    TODAY'S FHR: 157 via doppler  No results found for this or any previous visit (from the past 24 hour(s)).  Assessment & Plan:  1) Low-Risk Pregnancy [id number] at [redacted]w[redacted]d with an Estimated Date of Delivery: 09/14/23   2) Initial OB visit  3) AMA 37yo  4) Prev c/s x 3>for RCS  5) H/O heroin abuse> after pills w/ last c/s, clean x 56yrs! Concerned about pain meds post op, discussed  6) Late care  7) Smoker> 1+ppd prior to pregnancy, now 1/2ppd, counseled x 3-3mins, advised cessation, discussed risks to fetus while pregnant, to infant pp, and to herself. Offered QuitlineNC, declined, wants to try quitting on her own first   Meds:  Meds ordered this encounter  Medications   Blood Pressure  Monitor MISC    Sig: For regular home bp monitoring during pregnancy    Dispense:  1 each    Refill:  0    Z34.81 Please mail to patient    Initial labs obtained Continue prenatal vitamins Reviewed n/v relief measures and warning s/s to report Reviewed recommended weight gain based on pre-gravid BMI Encouraged well-balanced diet Genetic & carrier screening discussed: requests Panorama, AFP, and Horizon , too late for NT/IT Ultrasound discussed; fetal survey: requested CCNC completed> form faxed if has or is planning to apply for medicaid The nature of [place] [facility] [employer] with multiple MDs and other Advanced Practice Providers was explained to patient; also emphasized that fellows, residents, and students are part of our team. Does not have home bp cuff. Office bp cuff given: no. Rx sent: yes. Check bp weekly, let us know if consistently >140/90.   Follow-up: Return in about 4 weeks (around 04/25/2023) for LROB, ZH:YQMVHQI, CNM, in person.   Orders Placed This Encounter  Procedures   Urine Culture   GC/Chlamydia Probe Amp   US OB Comp + 14 Wk   PANORAMA PRENATAL TEST   CBC/D/Plt+RPR+Rh+ABO+RubIgG...   AFP, Serum, Open Spina Bifida   HORIZON CUSTOM    Cheral Marker CNM, WHNP-BC 03/28/2023 10:14 AM

## 2023-03-29 ENCOUNTER — Encounter: Payer: Self-pay | Admitting: Women's Health

## 2023-03-29 DIAGNOSIS — O26899 Other specified pregnancy related conditions, unspecified trimester: Secondary | ICD-10-CM | POA: Insufficient documentation

## 2023-03-29 DIAGNOSIS — Z6791 Unspecified blood type, Rh negative: Secondary | ICD-10-CM | POA: Insufficient documentation

## 2023-03-30 LAB — CBC/D/PLT+RPR+RH+ABO+RUBIGG...
Antibody Screen: NEGATIVE
Basophils Absolute: 0 10*3/uL (ref 0.0–0.2)
Basos: 0 %
EOS (ABSOLUTE): 0.1 10*3/uL (ref 0.0–0.4)
Eos: 1 %
HCV Ab: NONREACTIVE
HIV Screen 4th Generation wRfx: NONREACTIVE
Hematocrit: 36.6 % (ref 34.0–46.6)
Hemoglobin: 11.5 g/dL (ref 11.1–15.9)
Hepatitis B Surface Ag: NEGATIVE
Immature Grans (Abs): 0.1 10*3/uL (ref 0.0–0.1)
Immature Granulocytes: 1 %
Lymphocytes Absolute: 1.6 10*3/uL (ref 0.7–3.1)
Lymphs: 22 %
MCH: 30.2 pg (ref 26.6–33.0)
MCHC: 31.4 g/dL — ABNORMAL LOW (ref 31.5–35.7)
MCV: 96 fL (ref 79–97)
Monocytes Absolute: 0.5 10*3/uL (ref 0.1–0.9)
Monocytes: 7 %
Neutrophils Absolute: 4.9 10*3/uL (ref 1.4–7.0)
Neutrophils: 69 %
Platelets: 280 10*3/uL (ref 150–450)
RBC: 3.81 x10E6/uL (ref 3.77–5.28)
RDW: 14.3 % (ref 11.7–15.4)
RPR Ser Ql: NONREACTIVE
Rh Factor: NEGATIVE
Rubella Antibodies, IGG: 8.56 index (ref >0.99)
WBC: 7.2 10*3/uL (ref 3.4–10.8)

## 2023-03-30 LAB — AFP, SERUM, OPEN SPINA BIFIDA
AFP MoM: 1.33
AFP Value: 35.9 ng/mL
Gest. Age on Collection Date: 15 wk
Maternal Age At EDD: 37.9 a
OSBR Risk 1 IN: 4399
Weight: 167 [lb_av]

## 2023-03-30 LAB — URINE CULTURE: Organism ID, Bacteria: NO GROWTH

## 2023-03-30 LAB — HCV INTERPRETATION

## 2023-04-01 LAB — GC/CHLAMYDIA PROBE AMP
Chlamydia trachomatis, NAA: NEGATIVE
Neisseria Gonorrhoeae by PCR: NEGATIVE

## 2023-04-05 LAB — PANORAMA PRENATAL TEST FULL PANEL:PANORAMA TEST PLUS 5 ADDITIONAL MICRODELETIONS: FETAL FRACTION: 9.7

## 2023-04-06 LAB — HORIZON CUSTOM: REPORT SUMMARY: NEGATIVE

## 2023-04-27 ENCOUNTER — Ambulatory Visit (INDEPENDENT_AMBULATORY_CARE_PROVIDER_SITE_OTHER): Payer: Medicaid Other | Admitting: Women's Health

## 2023-04-27 ENCOUNTER — Ambulatory Visit (INDEPENDENT_AMBULATORY_CARE_PROVIDER_SITE_OTHER): Payer: Medicaid Other

## 2023-04-27 VITALS — BP 113/74 | HR 81 | Wt 171.0 lb

## 2023-04-27 DIAGNOSIS — Z3480 Encounter for supervision of other normal pregnancy, unspecified trimester: Secondary | ICD-10-CM

## 2023-04-27 DIAGNOSIS — Z363 Encounter for antenatal screening for malformations: Secondary | ICD-10-CM | POA: Diagnosis not present

## 2023-04-27 DIAGNOSIS — Z3A20 20 weeks gestation of pregnancy: Secondary | ICD-10-CM

## 2023-04-27 DIAGNOSIS — Z3482 Encounter for supervision of other normal pregnancy, second trimester: Secondary | ICD-10-CM

## 2023-04-27 DIAGNOSIS — O0932 Supervision of pregnancy with insufficient antenatal care, second trimester: Secondary | ICD-10-CM

## 2023-04-27 NOTE — Progress Notes (Signed)
LOW-RISK PREGNANCY VISIT Patient name: Beverly Leon MRN [id number]  Date of birth: [birth year] Chief Complaint:   Routine Prenatal Visit  History of Present Illness:   Beverly Leon is a 37 y.o. G6Y4034 female at [redacted]w[redacted]d with an Estimated Date of Delivery: 09/14/23 being seen today for ongoing management of a low-risk pregnancy.   Today she reports headaches. Contractions: Not present. Vag. Bleeding: None.  Movement: Present. denies leaking of fluid.     03/28/2023   10:06 AM 10/19/2022    9:31 AM 03/08/2018    9:31 AM  Depression screen PHQ 2/9  Decreased Interest 1 0 0  Down, Depressed, Hopeless 0 0 0  PHQ - 2 Score 1 0 0  Altered sleeping 2 0   Tired, decreased energy 2 1   Change in appetite 0 0   Feeling bad or failure about yourself  0 1   Trouble concentrating 2 1   Moving slowly or fidgety/restless 0 0   Suicidal thoughts 0 0   PHQ-9 Score 7 3         03/28/2023   10:07 AM 10/19/2022    9:32 AM  GAD 7 : Generalized Anxiety Score  Nervous, Anxious, on Edge 0 0  Control/stop worrying 0 0  Worry too much - different things 0 0  Trouble relaxing 0 0  Restless 1 0  Easily annoyed or irritable 1 1  Afraid - awful might happen 0 0  Total GAD 7 Score 2 1      Review of Systems:   Pertinent items are noted in HPI Denies abnormal vaginal discharge w/ itching/odor/irritation, headaches, visual changes, shortness of breath, chest pain, abdominal pain, severe nausea/vomiting, or problems with urination or bowel movements unless otherwise stated above. Pertinent History Reviewed:  Reviewed past medical,surgical, social, obstetrical and family history.  Reviewed problem list, medications and allergies. Physical Assessment:   Vitals:   04/27/23 1131  BP: 113/74  Pulse: 81  Weight: 171 lb (77.6 kg)  Body mass index is 29.35 kg/m.        Physical Examination:   General appearance: Well appearing, and in no distress  Mental status: Alert, oriented to person, place,  and time  Skin: Warm & dry  Cardiovascular: Normal heart rate noted  Respiratory: Normal respiratory effort, no distress  Abdomen: Soft, gravid, nontender  Pelvic: Cervical exam deferred         Extremities:    Fetal Status:     Movement: Present  [name] 20 wks,cephalic,anterior placenta gr 0,normal ovaries,cx 4.1 cm,single umbilical artery,absent right UA,SVP of fluid 5 cm,FHR 142 bpm,EFW 325 g 45%,anatomy complete   Chaperone: N/A   No results found for this or any previous visit (from the past 24 hour(s)).  Assessment & Plan:  1) Low-risk pregnancy G4P3004 at [redacted]w[redacted]d with an Estimated Date of Delivery: 09/14/23   2) SUA, discussed and gave printed info, EFW q 4-6w @ 28w  3) Headaches> gave printed prevention/relief measures    Meds: No orders of the defined types were placed in this encounter.  Labs/procedures today: U/S  Plan:  Continue routine obstetrical care  Next visit: prefers in person    Reviewed: Preterm labor symptoms and general obstetric precautions including but not limited to vaginal bleeding, contractions, leaking of fluid and fetal movement were reviewed in detail with the patient.  All questions were answered. Does have home bp cuff. Office bp cuff given: not applicable. Check bp weekly, let us know if consistently >  140 and/or >90.  Follow-up: Return in about 4 weeks (around 05/25/2023) for LROB, CNM, in person; then 28wks EFW, PN2, LROB CNM.  No future appointments.   No orders of the defined types were placed in this encounter.  Cheral Marker CNM, [facility] 04/27/2023 11:42 AM

## 2023-04-27 NOTE — Progress Notes (Signed)
[name] 20 wks,cephalic,anterior placenta gr 0,normal ovaries,cx 4.1 cm,single umbilical artery,absent right UA,SVP of fluid 5 cm,FHR 142 bpm,EFW 325 g 45%,anatomy complete

## 2023-04-27 NOTE — Patient Instructions (Addendum)
Beverly Leon, thank you for choosing our office today! We appreciate the opportunity to meet your healthcare needs. You may receive a short survey by mail, e-mail, or through [employer]. If you are happy with your care we would appreciate if you could take just a few minutes to complete the survey questions. We read all of your comments and take your feedback very seriously. Thank you again for choosing our office.  [facility] [employer] Team at [facility] at [facility] ([address] [place], [place] [address]) Entrance C, located off of [address] Free 24/7 valet parking  Go to [email or web address] to register for FREE online childbirth classes  Call the office [phone]) or go to [facility] if: You begin to severe cramping Your water breaks.  Sometimes it is a big gush of fluid, sometimes it is just a trickle that keeps getting your panties wet or running down your legs You have vaginal bleeding.  It is normal to have a small amount of spotting if your cervix was checked.   [facility] Pediatricians/Family Doctors [facility]): [address] Colette Ribas, [phone]           [facility]: [address], [phone]                [facility] Medicine [facility]): [address], [phone] (call to ask if accepting patients) [facility] Department: [address], [place], [phone]    [facility] Pediatricians/Family Doctors [facility]): [phone], [phone] [facility]: [address] [place], [phone] [facility]: [address], [phone]  [facility] Doctors  [facility]): [phone] [facility]: [address], [phone]   [facility] Doctors [facility]: [address], [phone]  [facility] Doctors  [facility]: [phone], [phone]  Home Blood Pressure Monitoring for Patients   Your provider has recommended that you check your blood pressure (BP) at least once a week at home. If you do not have a blood pressure cuff at home, one will be provided for you. Contact your provider if you have not received your monitor within 1 week.   Helpful Tips for Accurate Home Blood Pressure Checks  Don't smoke, exercise, or drink caffeine 30 minutes before checking your BP Use the restroom before checking your BP (a full bladder can raise your pressure) Relax in a comfortable upright chair Feet on the ground Left arm resting comfortably on a flat surface at the level of your heart Legs uncrossed Back supported Sit quietly and don't talk Place the cuff on your bare arm Adjust snuggly, so that only two fingertips can fit between your skin and the top of the cuff Check 2 readings separated by at least one minute Keep a log of your BP readings For a visual, please reference this diagram: http://ccnc.care/bpdiagram  Provider Name: [facility]     Phone: [phone]  Zone 1: ALL CLEAR  Continue to monitor your symptoms:  BP reading is less than 140 (top number) or less than 90 (bottom number)  No right upper stomach pain No headaches or seeing spots No feeling nauseated or throwing up No swelling in face and hands  Zone 2: CAUTION Call your doctor's office for any of the following:  BP reading is greater than 140 (top number) or greater than  90 (bottom number)  Stomach pain under your ribs in the middle or right side Headaches or seeing spots Feeling nauseated or throwing up Swelling in face and hands  Zone 3: EMERGENCY  Seek immediate medical care if you have any of the following:  BP reading is greater than160 (top number) or greater than 110 (bottom number) Severe headaches not improving with Tylenol Serious difficulty catching your breath Any worsening symptoms from  Zone 2     Second Trimester of Pregnancy The second trimester is from week 14 through week 27 (months 4 through 6). The second trimester is often a time when you feel your best. Your body has adjusted to being pregnant, and you begin to feel better physically. Usually, morning sickness has lessened or quit completely, you may have more energy, and you may have an increase in appetite. The second trimester is also a time when the fetus is growing rapidly. At the end of the sixth month, the fetus is about 9 inches long and weighs about 1 pounds. You will likely begin to feel the baby move (quickening) between 16 and 20 weeks of pregnancy. Body changes during your second trimester Your body continues to go through many changes during your second trimester. The changes vary from woman to woman. Your weight will continue to increase. You will notice your lower abdomen bulging out. You may begin to get stretch marks on your hips, abdomen, and breasts. You may develop headaches that can be relieved by medicines. The medicines should be approved by your health care provider. You may urinate more often because the fetus is pressing on your bladder. You may develop or continue to have heartburn as a result of your pregnancy. You may develop constipation because certain hormones are causing the muscles that push waste through your intestines to slow down. You may develop hemorrhoids or swollen, bulging veins (varicose veins). You may have back pain. This is caused by: Weight gain. Pregnancy hormones that are relaxing the joints in your pelvis. A shift in weight and the muscles that support your balance. Your breasts will continue to grow and they will continue to become tender. Your gums may bleed and may be sensitive to brushing and flossing. Dark spots or blotches (chloasma, mask of pregnancy) may develop on your face. This will likely fade after the baby is born. A dark line from your belly button to  the pubic area (linea nigra) may appear. This will likely fade after the baby is born. You may have changes in your hair. These can include thickening of your hair, rapid growth, and changes in texture. Some women also have hair loss during or after pregnancy, or hair that feels dry or thin. Your hair will most likely return to normal after your baby is born.  What to expect at prenatal visits During a routine prenatal visit: You will be weighed to make sure you and the fetus are growing normally. Your blood pressure will be taken. Your abdomen will be measured to track your baby's growth. The fetal heartbeat will be listened to. Any test results from the previous visit will be discussed.  Your health care provider may ask you: How you are feeling. If you are feeling the baby move. If you have had any abnormal symptoms, such as leaking fluid, bleeding, severe headaches, or abdominal cramping. If you are using any tobacco products, including cigarettes, chewing tobacco, and electronic cigarettes. If you have any questions.  Other tests that may be performed during  your second trimester include: Blood tests that check for: Low iron levels (anemia). High blood sugar that affects pregnant women (gestational diabetes) between 17 and 28 weeks. Rh antibodies. This is to check for a protein on red blood cells (Rh factor). Urine tests to check for infections, diabetes, or protein in the urine. An ultrasound to confirm the proper growth and development of the baby. An amniocentesis to check for possible genetic problems. Fetal screens for spina bifida and Down syndrome. HIV (human immunodeficiency virus) testing. Routine prenatal testing includes screening for HIV, unless you choose not to have this test.  Follow these instructions at home: Medicines Follow your health care provider's instructions regarding medicine use. Specific medicines may be either safe or unsafe to take during  pregnancy. Take a prenatal vitamin that contains at least 600 micrograms (mcg) of folic acid. If you develop constipation, try taking a stool softener if your health care provider approves. Eating and drinking Eat a balanced diet that includes fresh fruits and vegetables, whole grains, good sources of protein such as meat, eggs, or tofu, and low-fat dairy. Your health care provider will help you determine the amount of weight gain that is right for you. Avoid raw meat and uncooked cheese. These carry germs that can cause birth defects in the baby. If you have low calcium intake from food, talk to your health care provider about whether you should take a daily calcium supplement. Limit foods that are high in fat and processed sugars, such as fried and sweet foods. To prevent constipation: Drink enough fluid to keep your urine clear or pale yellow. Eat foods that are high in fiber, such as fresh fruits and vegetables, whole grains, and beans. Activity Exercise only as directed by your health care provider. Most women can continue their usual exercise routine during pregnancy. Try to exercise for 30 minutes at least 5 days a week. Stop exercising if you experience uterine contractions. Avoid heavy lifting, wear low heel shoes, and practice good posture. A sexual relationship may be continued unless your health care provider directs you otherwise. Relieving pain and discomfort Wear a good support bra to prevent discomfort from breast tenderness. Take warm sitz baths to soothe any pain or discomfort caused by hemorrhoids. Use hemorrhoid cream if your health care provider approves. Rest with your legs elevated if you have leg cramps or low back pain. If you develop varicose veins, wear support hose. Elevate your feet for 15 minutes, 3-4 times a day. Limit salt in your diet. Prenatal Care Write down your questions. Take them to your prenatal visits. Keep all your prenatal visits as told by your health  care provider. This is important. Safety Wear your seat belt at all times when driving. Make a list of emergency phone numbers, including numbers for family, friends, the hospital, and police and fire departments. General instructions Ask your health care provider for a referral to a local prenatal education class. Begin classes no later than the beginning of month 6 of your pregnancy. Ask for help if you have counseling or nutritional needs during pregnancy. Your health care provider can offer advice or refer you to specialists for help with various needs. Do not use hot tubs, steam rooms, or saunas. Do not douche or use tampons or scented sanitary pads. Do not cross your legs for long periods of time. Avoid cat litter boxes and soil used by cats. These carry germs that can cause birth defects in the baby and possibly loss of the  fetus by miscarriage or stillbirth. Avoid all smoking, herbs, alcohol, and unprescribed drugs. Chemicals in these products can affect the formation and growth of the baby. Do not use any products that contain nicotine or tobacco, such as cigarettes and e-cigarettes. If you need help quitting, ask your health care provider. Visit your dentist if you have not gone yet during your pregnancy. Use a soft toothbrush to brush your teeth and be gentle when you floss. Contact a health care provider if: You have dizziness. You have mild pelvic cramps, pelvic pressure, or nagging pain in the abdominal area. You have persistent nausea, vomiting, or diarrhea. You have a bad smelling vaginal discharge. You have pain when you urinate. Get help right away if: You have a fever. You are leaking fluid from your vagina. You have spotting or bleeding from your vagina. You have severe abdominal cramping or pain. You have rapid weight gain or weight loss. You have shortness of breath with chest pain. You notice sudden or extreme swelling of your face, hands, ankles, feet, or legs. You  have not felt your baby move in over an hour. You have severe headaches that do not go away when you take medicine. You have vision changes. Summary The second trimester is from week 14 through week 27 (months 4 through 6). It is also a time when the fetus is growing rapidly. Your body goes through many changes during pregnancy. The changes vary from woman to woman. Avoid all smoking, herbs, alcohol, and unprescribed drugs. These chemicals affect the formation and growth your baby. Do not use any tobacco products, such as cigarettes, chewing tobacco, and e-cigarettes. If you need help quitting, ask your health care provider. Contact your health care provider if you have any questions. Keep all prenatal visits as told by your health care provider. This is important. This information is not intended to replace advice given to you by your health care provider. Make sure you discuss any questions you have with your health care provider. Document Released: 06/14/2001 Document Revised: 11/26/2015 Document Reviewed: 08/21/2012 Elsevier Interactive Patient Education  2017 [employer] Inc[employer]   For Headaches:  Stay well hydrated, drink enough water so that your urine is clear, sometimes if you are dehydrated you can get headaches Eat small frequent meals and snacks, sometimes if you are hungry you can get headaches Sometimes you get headaches during pregnancy from the pregnancy hormones You can try tylenol (1-2 regular strength 325mg  or 1-2 extra strength 500mg ) as directed on the box. The least amount of medication that works is best.  Cool compresses (cool wet washcloth or ice pack) to area of head that is hurting You can also try drinking a caffeinated drink to see if this will help If not helping, try below:  For Prevention of Headaches/Migraines: CoQ10 100mg  three times daily Vitamin B2 400mg  daily Magnesium Oxide 400-600mg  daily  Foods to alleviate migraines:  1) dark leafy greens 2) avocado 3)  tuna 4) salmon  5) beans and legumes  Foods to avoid: 1) Excessive (or irregular timing) coffee 3) aged cheeses 4) chocolate 5) citrus fruits 6) aspartame and other artifical sweeteners 7) yeast 8) MSG (in processed foods) 9) processed and cured meats 10) nuts and certain seeds 11) chicken livers and other organ meats 12) dairy products like buttermilk, sour cream, and yogurt 13) dried fruits like dates, figs, and raisins 14) garlic 15) onions 16) potato chips 17) pickled foods like olives and sauerkraut 18) some fresh fruits like ripe banana,  papaya, red plums, raspberries, kiwi, pineapple 19) tomato-based products  Recommend to keep a migraine diary: rate daily the severity of your headache (1-10) and what foods you eat that day to help determine patterns.   If You Get a Bad Headache/Migraine: Benadryl 25mg   Magnesium Oxide 1 large Gatorade 2 extra strength Tylenol (1,000mg  total) 1 cup coffee or Coke      If this doesn't help please call us @ [phone]

## 2023-05-25 ENCOUNTER — Ambulatory Visit (INDEPENDENT_AMBULATORY_CARE_PROVIDER_SITE_OTHER): Payer: Medicaid Other | Admitting: Women's Health

## 2023-05-25 ENCOUNTER — Encounter: Payer: Self-pay | Admitting: Women's Health

## 2023-05-25 VITALS — BP 115/78 | HR 65 | Wt 175.8 lb

## 2023-05-25 DIAGNOSIS — Z3A24 24 weeks gestation of pregnancy: Secondary | ICD-10-CM

## 2023-05-25 DIAGNOSIS — Z3480 Encounter for supervision of other normal pregnancy, unspecified trimester: Secondary | ICD-10-CM

## 2023-05-25 DIAGNOSIS — Q270 Congenital absence and hypoplasia of umbilical artery: Secondary | ICD-10-CM

## 2023-05-25 DIAGNOSIS — Z3482 Encounter for supervision of other normal pregnancy, second trimester: Secondary | ICD-10-CM

## 2023-05-25 NOTE — Patient Instructions (Addendum)
Carollee Herter, thank you for choosing our office today! We appreciate the opportunity to meet your healthcare needs. You may receive a short survey by mail, e-mail, or through [employer]. If you are happy with your care we would appreciate if you could take just a few minutes to complete the survey questions. We read all of your comments and take your feedback very seriously. Thank you again for choosing our office.  Center for [employer] Team at [facility]  [facility] at [facility] ([address] [place], [place] [address]) Entrance C, located off of [address] Free 24/7 valet parking   You will have your sugar test next visit.  Please do not eat or drink anything after midnight the night before you come, not even water.  You will be here for at least two hours.  Please make an appointment online for the bloodwork at [email or web address] for 8:00am (or as close to this as possible). Make sure you select the [facility] service center.   Constipation Drink plenty of fluid, preferably water, throughout the day Eat foods high in fiber such as fruits, vegetables, and grains Exercise, such as walking, is a good way to keep your bowels regular Drink warm fluids, especially warm prune juice, or decaf coffee Eat a 1/2 cup of real oatmeal (not instant), 1/2 cup applesauce, and 1/2-1 cup warm prune juice every day If needed, you may take Colace (docusate sodium) stool softener once or twice a day to help keep the stool soft. If you are pregnant, wait until you are out of your first trimester (12-14 weeks of pregnancy) If you still are having problems with constipation, you may take Miralax once daily as needed to help keep your bowels regular.  If you are pregnant, wait until you are out of your first trimester (12-14 weeks of pregnancy)    CLASSES: Go to [email or web address] to register for classes (childbirth, breastfeeding, waterbirth, infant CPR, daddy bootcamp, etc.)  Call the office  [phone]) or go to [facility] if: You begin to have strong, frequent contractions Your water breaks.  Sometimes it is a big gush of fluid, sometimes it is just a trickle that keeps getting your panties wet or running down your legs You have vaginal bleeding.  It is normal to have a small amount of spotting if your cervix was checked.  You don't feel your baby moving like normal.  If you don't, get you something to eat and drink and lay down and focus on feeling your baby move.   If your baby is still not moving like normal, you should call the office or go to [facility].  Call the office [phone]) or go to [facility] hospital for these signs of pre-eclampsia: Severe headache that does not go away with Tylenol Visual changes- seeing spots, double, blurred vision Pain under your right breast or upper abdomen that does not go away with Tums or heartburn medicine Nausea and/or vomiting Severe swelling in your hands, feet, and face    [place] [facility]/Family Doctors [facility]): [address] Colette Ribas, [phone]           [facility]: [address], [phone]                [facility]): [address], [phone]  [facility] Department: [address], [place], [phone]    [facility] Pediatricians/Family Doctors [facility]): [address],  [address], [phone] [facility]: [address] [place], [phone] [facility]: [address], [phone]  [facility] Doctors  [facility]): [phone] [facility]: [address], [phone]   [facility] Doctors [facility]: [address] [phone]  [facility] Doctors  [facility]: [phone], [phone]  Home Blood Pressure Monitoring for Patients   Your provider  has recommended that you check your blood pressure (BP) at least once a week at home. If you do not have a blood pressure cuff at home, one will be provided for you. Contact your provider if you have not received your monitor within 1 week.   Helpful Tips for Accurate Home Blood Pressure Checks  Don't smoke, exercise, or drink caffeine 30 minutes before checking your BP Use the restroom before checking your BP (a full bladder can raise your pressure) Relax in a comfortable upright chair Feet on the ground Left arm resting comfortably on a flat surface at the level of your heart Legs uncrossed Back supported Sit quietly and don't talk Place the cuff on your bare arm Adjust snuggly, so that only two fingertips can fit between your skin and the top of the cuff Check 2 readings separated by at least one minute Keep a log of your BP readings For a visual, please reference this diagram: http://ccnc.care/bpdiagram  Provider Name: [facility]     Phone: [phone]  Zone 1: ALL CLEAR  Continue to monitor your symptoms:  BP reading is less than 140 (top number) or less than 90 (bottom number)  No right upper stomach pain No headaches or seeing spots No feeling nauseated or throwing up No swelling in face and hands  Zone 2: CAUTION Call your doctor's office for any of the following:  BP reading is greater than 140 (top number) or greater than 90 (bottom number)  Stomach pain under your ribs in the middle or right side Headaches or seeing spots Feeling nauseated or throwing up Swelling in face and hands  Zone 3: EMERGENCY  Seek immediate medical care if you have any of the following:  BP reading is greater than160 (top number) or greater than 110 (bottom number) Severe headaches not improving with Tylenol Serious difficulty catching your breath Any worsening symptoms from Zone 2   Second Trimester of Pregnancy The second trimester is from week 13 through week 28, months 4  through 6. The second trimester is often a time when you feel your best. Your body has also adjusted to being pregnant, and you begin to feel better physically. Usually, morning sickness has lessened or quit completely, you may have more energy, and you may have an increase in appetite. The second trimester is also a time when the fetus is growing rapidly. At the end of the sixth month, the fetus is about 9 inches long and weighs about 1 pounds. You will likely begin to feel the baby move (quickening) between 18 and 20 weeks of the pregnancy. BODY CHANGES Your body goes through many changes during pregnancy. The changes vary from woman to woman.  Your weight will continue to increase. You will notice your lower abdomen bulging out. You may begin to get stretch marks on your hips, abdomen, and breasts. You may develop headaches that can be relieved by medicines approved by your health care provider. You may urinate more often because the fetus is pressing on your bladder. You  may develop or continue to have heartburn as a result of your pregnancy. You may develop constipation because certain hormones are causing the muscles that push waste through your intestines to slow down. You may develop hemorrhoids or swollen, bulging veins (varicose veins). You may have back pain because of the weight gain and pregnancy hormones relaxing your joints between the bones in your pelvis and as a result of a shift in weight and the muscles that support your balance. Your breasts will continue to grow and be tender. Your gums may bleed and may be sensitive to brushing and flossing. Dark spots or blotches (chloasma, mask of pregnancy) may develop on your face. This will likely fade after the baby is born. A dark line from your belly button to the pubic area (linea nigra) may appear. This will likely fade after the baby is born. You may have changes in your hair. These can include thickening of your hair, rapid growth,  and changes in texture. Some women also have hair loss during or after pregnancy, or hair that feels dry or thin. Your hair will most likely return to normal after your baby is born. WHAT TO EXPECT AT YOUR PRENATAL VISITS During a routine prenatal visit: You will be weighed to make sure you and the fetus are growing normally. Your blood pressure will be taken. Your abdomen will be measured to track your baby's growth. The fetal heartbeat will be listened to. Any test results from the previous visit will be discussed. Your health care provider may ask you: How you are feeling. If you are feeling the baby move. If you have had any abnormal symptoms, such as leaking fluid, bleeding, severe headaches, or abdominal cramping. If you have any questions. Other tests that may be performed during your second trimester include: Blood tests that check for: Low iron levels (anemia). Gestational diabetes (between 24 and 28 weeks). Rh antibodies. Urine tests to check for infections, diabetes, or protein in the urine. An ultrasound to confirm the proper growth and development of the baby. An amniocentesis to check for possible genetic problems. Fetal screens for spina bifida and Down syndrome. HOME CARE INSTRUCTIONS  Avoid all smoking, herbs, alcohol, and unprescribed drugs. These chemicals affect the formation and growth of the baby. Follow your health care provider's instructions regarding medicine use. There are medicines that are either safe or unsafe to take during pregnancy. Exercise only as directed by your health care provider. Experiencing uterine cramps is a good sign to stop exercising. Continue to eat regular, healthy meals. Wear a good support bra for breast tenderness. Do not use hot tubs, steam rooms, or saunas. Wear your seat belt at all times when driving. Avoid raw meat, uncooked cheese, cat litter boxes, and soil used by cats. These carry germs that can cause birth defects in the  baby. Take your prenatal vitamins. Try taking a stool softener (if your health care provider approves) if you develop constipation. Eat more high-fiber foods, such as fresh vegetables or fruit and whole grains. Drink plenty of fluids to keep your urine clear or pale yellow. Take warm sitz baths to soothe any pain or discomfort caused by hemorrhoids. Use hemorrhoid cream if your health care provider approves. If you develop varicose veins, wear support hose. Elevate your feet for 15 minutes, 3-4 times a day. Limit salt in your diet. Avoid heavy lifting, wear low heel shoes, and practice good posture. Rest with your legs elevated if you have leg cramps or low back  pain. Visit your dentist if you have not gone yet during your pregnancy. Use a soft toothbrush to brush your teeth and be gentle when you floss. A sexual relationship may be continued unless your health care provider directs you otherwise. Continue to go to all your prenatal visits as directed by your health care provider. SEEK MEDICAL CARE IF:  You have dizziness. You have mild pelvic cramps, pelvic pressure, or nagging pain in the abdominal area. You have persistent nausea, vomiting, or diarrhea. You have a bad smelling vaginal discharge. You have pain with urination. SEEK IMMEDIATE MEDICAL CARE IF:  You have a fever. You are leaking fluid from your vagina. You have spotting or bleeding from your vagina. You have severe abdominal cramping or pain. You have rapid weight gain or loss. You have shortness of breath with chest pain. You notice sudden or extreme swelling of your face, hands, ankles, feet, or legs. You have not felt your baby move in over an hour. You have severe headaches that do not go away with medicine. You have vision changes. Document Released: 06/14/2001 Document Revised: 06/25/2013 Document Reviewed: 08/21/2012 [facility] Patient Information 2015 [place], [place]. This information is not intended to replace advice  given to you by your health care provider. Make sure you discuss any questions you have with your health care provider.

## 2023-05-25 NOTE — Progress Notes (Signed)
LOW-RISK PREGNANCY VISIT Patient name: Beverly Leon MRN [id number]  Date of birth: 03-18-1986 Chief Complaint:   Routine Prenatal Visit  History of Present Illness:   Beverly Leon is a 37 y.o. [id number] female at [redacted]w[redacted]d with an Estimated Date of Delivery: 09/14/23 being seen today for ongoing management of a low-risk pregnancy.   Today she reports  constipation, hemorrhoid-getting better, pressure . Contractions: Not present.  .  Movement: Present. denies leaking of fluid.     03/28/2023   10:06 AM 10/19/2022    9:31 AM 03/08/2018    9:31 AM  Depression screen PHQ 2/9  Decreased Interest 1 0 0  Down, Depressed, Hopeless 0 0 0  PHQ - 2 Score 1 0 0  Altered sleeping 2 0   Tired, decreased energy 2 1   Change in appetite 0 0   Feeling bad or failure about yourself  0 1   Trouble concentrating 2 1   Moving slowly or fidgety/restless 0 0   Suicidal thoughts 0 0   PHQ-9 Score 7 3         03/28/2023   10:07 AM 10/19/2022    9:32 AM  GAD 7 : Generalized Anxiety Score  Nervous, Anxious, on Edge 0 0  Control/stop worrying 0 0  Worry too much - different things 0 0  Trouble relaxing 0 0  Restless 1 0  Easily annoyed or irritable 1 1  Afraid - awful might happen 0 0  Total GAD 7 Score 2 1      Review of Systems:   Pertinent items are noted in HPI Denies abnormal vaginal discharge w/ itching/odor/irritation, headaches, visual changes, shortness of breath, chest pain, abdominal pain, severe nausea/vomiting, or problems with urination or bowel movements unless otherwise stated above. Pertinent History Reviewed:  Reviewed past medical,surgical, social, obstetrical and family history.  Reviewed problem list, medications and allergies. Physical Assessment:   Vitals:   05/25/23 1349  BP: 115/78  Pulse: 65  Weight: 175 lb 12.8 oz (79.7 kg)  Body mass index is 30.18 kg/m.        Physical Examination:   General appearance: Well appearing, and in no distress  Mental status:  Alert, oriented to person, place, and time  Skin: Warm & dry  Cardiovascular: Normal heart rate noted  Respiratory: Normal respiratory effort, no distress  Abdomen: Soft, gravid, nontender  Pelvic: Cervical exam deferred         Extremities: Edema: None  Fetal Status: Fetal Heart Rate (bpm): 160 Fundal Height: 24 cm Movement: Present    Chaperone: N/A   No results found for this or any previous visit (from the past 24 hour(s)).  Assessment & Plan:  1) Low-risk pregnancy [id number] at [redacted]w[redacted]d with an Estimated Date of Delivery: 09/14/23   2) Constipation w/ hemorrhoid, gave printed prevention/relief measures, can use otc preparation H/TUCKS pads  3) Prev c/s x 3> for RCS  4) SUA> efw next visit   Meds: No orders of the defined types were placed in this encounter.  Labs/procedures today: declined flu shot  Plan:  Continue routine obstetrical care  Next visit: prefers will be in person for pn2, u/s     Reviewed: Preterm labor symptoms and general obstetric precautions including but not limited to vaginal bleeding, contractions, leaking of fluid and fetal movement were reviewed in detail with the patient.  All questions were answered. Does have home bp cuff. Office bp cuff given: not applicable. Check bp weekly, let  us know if consistently >140 and/or >90.  Follow-up: Return for As scheduled.  Future Appointments  Date Time Provider Department Center  06/22/2023  8:30 AM [facility]  06/22/2023  8:30 AM [facility] [name] [facility] None  06/22/2023  9:30 AM Cheral Marker, CNM [facility]    Orders Placed This Encounter  Procedures   US OB Follow Up   Cheral Marker CNM, [facility] 05/25/2023 2:07 PM

## 2023-06-22 ENCOUNTER — Ambulatory Visit: Payer: Medicaid Other

## 2023-06-22 ENCOUNTER — Encounter: Payer: Self-pay | Admitting: Women's Health

## 2023-06-22 ENCOUNTER — Ambulatory Visit (INDEPENDENT_AMBULATORY_CARE_PROVIDER_SITE_OTHER): Payer: Medicaid Other | Admitting: Women's Health

## 2023-06-22 ENCOUNTER — Other Ambulatory Visit: Payer: Medicaid Other

## 2023-06-22 VITALS — BP 108/71 | HR 80 | Wt 177.4 lb

## 2023-06-22 DIAGNOSIS — Z3480 Encounter for supervision of other normal pregnancy, unspecified trimester: Secondary | ICD-10-CM

## 2023-06-22 DIAGNOSIS — Q270 Congenital absence and hypoplasia of umbilical artery: Secondary | ICD-10-CM | POA: Diagnosis not present

## 2023-06-22 DIAGNOSIS — Z3482 Encounter for supervision of other normal pregnancy, second trimester: Secondary | ICD-10-CM

## 2023-06-22 DIAGNOSIS — Z3A27 27 weeks gestation of pregnancy: Secondary | ICD-10-CM

## 2023-06-22 DIAGNOSIS — Z3A28 28 weeks gestation of pregnancy: Secondary | ICD-10-CM | POA: Diagnosis not present

## 2023-06-22 DIAGNOSIS — Z131 Encounter for screening for diabetes mellitus: Secondary | ICD-10-CM

## 2023-06-22 DIAGNOSIS — Z3483 Encounter for supervision of other normal pregnancy, third trimester: Secondary | ICD-10-CM

## 2023-06-22 DIAGNOSIS — O99413 Diseases of the circulatory system complicating pregnancy, third trimester: Secondary | ICD-10-CM

## 2023-06-22 NOTE — Patient Instructions (Signed)
Beverly Leon, thank you for choosing our office today! We appreciate the opportunity to meet your healthcare needs. You may receive a short survey by mail, e-mail, or through [employer]. If you are happy with your care we would appreciate if you could take just a few minutes to complete the survey questions. We read all of your comments and take your feedback very seriously. Thank you again for choosing our office.  Center for [employer] Team at [facility]  [facility] at [facility] ([address] [place], [place] [address]) Entrance C, located off of [address] Free 24/7 valet parking   CLASSES: Go to [email or web address] to register for classes (childbirth, breastfeeding, waterbirth, infant CPR, daddy bootcamp, etc.)  Call the office [phone]) or go to [facility] if: You begin to have strong, frequent contractions Your water breaks.  Sometimes it is a big gush of fluid, sometimes it is just a trickle that keeps getting your panties wet or running down your legs You have vaginal bleeding.  It is normal to have a small amount of spotting if your cervix was checked.  You don't feel your baby moving like normal.  If you don't, get you something to eat and drink and lay down and focus on feeling your baby move.   If your baby is still not moving like normal, you should call the office or go to [facility].  Call the office [phone]) or go to [facility] hospital for these signs of pre-eclampsia: Severe headache that does not go away with Tylenol Visual changes- seeing spots, double, blurred vision Pain under your right breast or upper abdomen that does not go away with Tums or heartburn medicine Nausea and/or vomiting Severe swelling in your hands, feet, and face   Tdap Vaccine It is recommended that you get the Tdap vaccine during the third trimester of EACH pregnancy to help protect your baby from getting pertussis (whooping cough) 27-36 weeks is the BEST time to do  this so that you can pass the protection on to your baby. During pregnancy is better than after pregnancy, but if you are unable to get it during pregnancy it will be offered at the hospital.  You can get this vaccine with [name], at the health department, your family doctor, or some local pharmacies Everyone who will be around your baby should also be up-to-date on their vaccines before the baby comes. Adults (who are not pregnant) only need 1 dose of Tdap during adulthood.   [facility] Pediatricians/Family Doctors [facility]): [address] Colette Ribas, [phone]           [facility]: [address], [phone]                [facility]): [address], [phone] (call to ask if accepting patients) [facility] Department: [address], [place], [phone]    [facility] Pediatricians/Family Doctors [facility]): [phone] [address], [phone] [facility]: [address] [place], [phone] [facility] [place]: [address], [phone]  [facility] Doctors  [facility]): [phone] [facility]: [address] [phone]   [facility] Doctors [facility]: [address] [phone]  [facility] Family Doctors  [facility]: [phone], [phone]  Home Blood Pressure Monitoring for Patients   Your provider has recommended that you check your  blood pressure (BP) at least once a week at home. If you do not have a blood pressure cuff at home, one will be provided for you. Contact your provider if you have not received your monitor within 1 week.   Helpful Tips for Accurate Home Blood Pressure Checks  Don't smoke, exercise, or drink caffeine 30 minutes before checking your BP Use the restroom before checking your BP (a full bladder can raise your  pressure) Relax in a comfortable upright chair Feet on the ground Left arm resting comfortably on a flat surface at the level of your heart Legs uncrossed Back supported Sit quietly and don't talk Place the cuff on your bare arm Adjust snuggly, so that only two fingertips can fit between your skin and the top of the cuff Check 2 readings separated by at least one minute Keep a log of your BP readings For a visual, please reference this diagram: http://ccnc.care/bpdiagram  Provider Name: [facility]     Phone: [phone]  Zone 1: ALL CLEAR  Continue to monitor your symptoms:  BP reading is less than 140 (top number) or less than 90 (bottom number)  No right upper stomach pain No headaches or seeing spots No feeling nauseated or throwing up No swelling in face and hands  Zone 2: CAUTION Call your doctor's office for any of the following:  BP reading is greater than 140 (top number) or greater than 90 (bottom number)  Stomach pain under your ribs in the middle or right side Headaches or seeing spots Feeling nauseated or throwing up Swelling in face and hands  Zone 3: EMERGENCY  Seek immediate medical care if you have any of the following:  BP reading is greater than160 (top number) or greater than 110 (bottom number) Severe headaches not improving with Tylenol Serious difficulty catching your breath Any worsening symptoms from Zone 2   Third Trimester of Pregnancy The third trimester is from week 29 through week 42, months 7 through 9. The third trimester is a time when the fetus is growing rapidly. At the end of the ninth month, the fetus is about 20 inches in length and weighs 6-10 pounds.  BODY CHANGES Your body goes through many changes during pregnancy. The changes vary from woman to woman.  Your weight will continue to increase. You can expect to gain 25-35 pounds (11-16 kg) by the end of the pregnancy. You may begin to get stretch marks on your hips, abdomen,  and breasts. You may urinate more often because the fetus is moving lower into your pelvis and pressing on your bladder. You may develop or continue to have heartburn as a result of your pregnancy. You may develop constipation because certain hormones are causing the muscles that push waste through your intestines to slow down. You may develop hemorrhoids or swollen, bulging veins (varicose veins). You may have pelvic pain because of the weight gain and pregnancy hormones relaxing your joints between the bones in your pelvis. Backaches may result from overexertion of the muscles supporting your posture. You may have changes in your hair. These can include thickening of your hair, rapid growth, and changes in texture. Some women also have hair loss during or after pregnancy, or hair that feels dry or thin. Your hair will most likely return to normal after your baby is born. Your breasts will continue to grow and be tender. A yellow discharge may leak from your breasts called colostrum. Your belly button may stick out. You may   feel short of breath because of your expanding uterus. You may notice the fetus "dropping," or moving lower in your abdomen. You may have a bloody mucus discharge. This usually occurs a few days to a week before labor begins. Your cervix becomes thin and soft (effaced) near your due date. WHAT TO EXPECT AT YOUR PRENATAL EXAMS  You will have prenatal exams every 2 weeks until week 36. Then, you will have weekly prenatal exams. During a routine prenatal visit: You will be weighed to make sure you and the fetus are growing normally. Your blood pressure is taken. Your abdomen will be measured to track your baby's growth. The fetal heartbeat will be listened to. Any test results from the previous visit will be discussed. You may have a cervical check near your due date to see if you have effaced. At around 36 weeks, your caregiver will check your cervix. At the same time, your  caregiver will also perform a test on the secretions of the vaginal tissue. This test is to determine if a type of bacteria, Group B streptococcus, is present. Your caregiver will explain this further. Your caregiver may ask you: What your birth plan is. How you are feeling. If you are feeling the baby move. If you have had any abnormal symptoms, such as leaking fluid, bleeding, severe headaches, or abdominal cramping. If you have any questions. Other tests or screenings that may be performed during your third trimester include: Blood tests that check for low iron levels (anemia). Fetal testing to check the health, activity level, and growth of the fetus. Testing is done if you have certain medical conditions or if there are problems during the pregnancy. FALSE LABOR You may feel small, irregular contractions that eventually go away. These are called Braxton Hicks contractions, or false labor. Contractions may last for hours, days, or even weeks before true labor sets in. If contractions come at regular intervals, intensify, or become painful, it is best to be seen by your caregiver.  SIGNS OF LABOR  Menstrual-like cramps. Contractions that are 5 minutes apart or less. Contractions that start on the top of the uterus and spread down to the lower abdomen and back. A sense of increased pelvic pressure or back pain. A watery or bloody mucus discharge that comes from the vagina. If you have any of these signs before the 37th week of pregnancy, call your caregiver right away. You need to go to the hospital to get checked immediately. HOME CARE INSTRUCTIONS  Avoid all smoking, herbs, alcohol, and unprescribed drugs. These chemicals affect the formation and growth of the baby. Follow your caregiver's instructions regarding medicine use. There are medicines that are either safe or unsafe to take during pregnancy. Exercise only as directed by your caregiver. Experiencing uterine cramps is a good sign to  stop exercising. Continue to eat regular, healthy meals. Wear a good support bra for breast tenderness. Do not use hot tubs, steam rooms, or saunas. Wear your seat belt at all times when driving. Avoid raw meat, uncooked cheese, cat litter boxes, and soil used by cats. These carry germs that can cause birth defects in the baby. Take your prenatal vitamins. Try taking a stool softener (if your caregiver approves) if you develop constipation. Eat more high-fiber foods, such as fresh vegetables or fruit and whole grains. Drink plenty of fluids to keep your urine clear or pale yellow. Take warm sitz baths to soothe any pain or discomfort caused by hemorrhoids. Use hemorrhoid cream if   your caregiver approves. If you develop varicose veins, wear support hose. Elevate your feet for 15 minutes, 3-4 times a day. Limit salt in your diet. Avoid heavy lifting, wear low heal shoes, and practice good posture. Rest a lot with your legs elevated if you have leg cramps or low back pain. Visit your dentist if you have not gone during your pregnancy. Use a soft toothbrush to brush your teeth and be gentle when you floss. A sexual relationship may be continued unless your caregiver directs you otherwise. Do not travel far distances unless it is absolutely necessary and only with the approval of your caregiver. Take prenatal classes to understand, practice, and ask questions about the labor and delivery. Make a trial run to the hospital. Pack your hospital bag. Prepare the baby's nursery. Continue to go to all your prenatal visits as directed by your caregiver. SEEK MEDICAL CARE IF: You are unsure if you are in labor or if your water has broken. You have dizziness. You have mild pelvic cramps, pelvic pressure, or nagging pain in your abdominal area. You have persistent nausea, vomiting, or diarrhea. You have a bad smelling vaginal discharge. You have pain with urination. SEEK IMMEDIATE MEDICAL CARE IF:  You  have a fever. You are leaking fluid from your vagina. You have spotting or bleeding from your vagina. You have severe abdominal cramping or pain. You have rapid weight loss or gain. You have shortness of breath with chest pain. You notice sudden or extreme swelling of your face, hands, ankles, feet, or legs. You have not felt your baby move in over an hour. You have severe headaches that do not go away with medicine. You have vision changes. Document Released: 06/14/2001 Document Revised: 06/25/2013 Document Reviewed: 08/21/2012 [facility] Patient Information 2015 [place], [place]. This information is not intended to replace advice given to you by your health care provider. Make sure you discuss any questions you have with your health care provider.

## 2023-06-22 NOTE — Progress Notes (Signed)
[name] 28 wks,cephalic,anterior placenta gr 0,AFI 17 cm,FHR 135 bpm,single umbilical artery

## 2023-06-22 NOTE — Progress Notes (Signed)
LOW-RISK PREGNANCY VISIT Patient name: Beverly Leon MRN [id number]  Date of birth: 04-Jan-1986 Chief Complaint:   Routine Prenatal Visit and Pregnancy Ultrasound (PN2)  History of Present Illness:   Beverly Leon is a 37 y.o. [id number] female at [redacted]w[redacted]d with an Estimated Date of Delivery: 09/14/23 being seen today for ongoing management of a low-risk pregnancy.   Today she reports no complaints. Contractions: Not present.  .  Movement: Present. denies leaking of fluid.     06/22/2023    9:23 AM 03/28/2023   10:06 AM 10/19/2022    9:31 AM 03/08/2018    9:31 AM  Depression screen PHQ 2/9  Decreased Interest 1 1 0 0  Down, Depressed, Hopeless 0 0 0 0  PHQ - 2 Score 1 1 0 0  Altered sleeping 0 2 0   Tired, decreased energy 2 2 1    Change in appetite 0 0 0   Feeling bad or failure about yourself  0 0 1   Trouble concentrating 1 2 1    Moving slowly or fidgety/restless 0 0 0   Suicidal thoughts 0 0 0   PHQ-9 Score 4 7 3          03/28/2023   10:07 AM 10/19/2022    9:32 AM  GAD 7 : Generalized Anxiety Score  Nervous, Anxious, on Edge 0 0  Control/stop worrying 0 0  Worry too much - different things 0 0  Trouble relaxing 0 0  Restless 1 0  Easily annoyed or irritable 1 1  Afraid - awful might happen 0 0  Total GAD 7 Score 2 1      Review of Systems:   Pertinent items are noted in HPI Denies abnormal vaginal discharge w/ itching/odor/irritation, headaches, visual changes, shortness of breath, chest pain, abdominal pain, severe nausea/vomiting, or problems with urination or bowel movements unless otherwise stated above. Pertinent History Reviewed:  Reviewed past medical,surgical, social, obstetrical and family history.  Reviewed problem list, medications and allergies. Physical Assessment:   Vitals:   06/22/23 0906  BP: 108/71  Pulse: 80  Weight: 177 lb 6.4 oz (80.5 kg)  Body mass index is 30.45 kg/m.        Physical Examination:   General appearance: Well appearing,  and in no distress  Mental status: Alert, oriented to person, place, and time  Skin: Warm & dry  Cardiovascular: Normal heart rate noted  Respiratory: Normal respiratory effort, no distress  Abdomen: Soft, gravid, nontender  Pelvic: Cervical exam deferred         Extremities: Edema: None  Fetal Status:     Movement: Present   [name] 28 wks,cephalic,anterior placenta gr 0,AFI 17 cm,FHR 135 bpm,single umbilical artery   Chaperone: N/A   No results found for this or any previous visit (from the past 24 hours).  Assessment & Plan:  1) Low-risk pregnancy [id number] at [redacted]w[redacted]d with an Estimated Date of Delivery: 09/14/23   2) SUA, EFW today 54%  3) Prev c/s x 3> for RCS w/ BTS  4) Wants BTS>reviewed risks/benefits, discussed high incidence regret <30yo if appropriate, LARCs just as effective, consent signed today    Meds: No orders of the defined types were placed in this encounter.  Labs/procedures today: PN2 and declined Tdap  Plan:  Continue routine obstetrical care  Next visit: prefers in person    Reviewed: Preterm labor symptoms and general obstetric precautions including but not limited to vaginal bleeding, contractions, leaking of fluid and fetal movement  were reviewed in detail with the patient.  All questions were answered. Does have home bp cuff. Office bp cuff given: not applicable. Check bp weekly, let us know if consistently >140 and/or >90.  Follow-up: Return in about 2 weeks (around 07/06/2023) for LROB, CNM, in person; then 4wks from now EFW u/s w/ LROB w/ CNM.  Future Appointments  Date Time Provider Department Center  06/22/2023  9:30 AM Cheral Marker, CNM [facility]    No orders of the defined types were placed in this encounter.  Cheral Marker CNM, [facility] 06/22/2023 9:26 AM

## 2023-06-23 LAB — CBC
Hematocrit: 32.5 % — ABNORMAL LOW (ref 34.0–46.6)
Hemoglobin: 10.7 g/dL — ABNORMAL LOW (ref 11.1–15.9)
MCH: 32.9 pg (ref 26.6–33.0)
MCHC: 32.9 g/dL (ref 31.5–35.7)
MCV: 100 fL — ABNORMAL HIGH (ref 79–97)
Platelets: 261 10*3/uL (ref 150–450)
RBC: 3.25 x10E6/uL — ABNORMAL LOW (ref 3.77–5.28)
RDW: 12.5 % (ref 11.7–15.4)
WBC: 9 10*3/uL (ref 3.4–10.8)

## 2023-06-23 LAB — ANTIBODY SCREEN: Antibody Screen: NEGATIVE

## 2023-06-23 LAB — RPR: RPR Ser Ql: NONREACTIVE

## 2023-06-23 LAB — GLUCOSE TOLERANCE, 2 HOURS W/ 1HR
Glucose, 1 hour: 103 mg/dL (ref 70–179)
Glucose, 2 hour: 77 mg/dL (ref 70–152)
Glucose, Fasting: 82 mg/dL (ref 70–91)

## 2023-06-23 LAB — HIV ANTIBODY (ROUTINE TESTING W REFLEX): HIV Screen 4th Generation wRfx: NONREACTIVE

## 2023-06-26 ENCOUNTER — Other Ambulatory Visit: Payer: Self-pay | Admitting: Women's Health

## 2023-06-26 MED ORDER — FERROUS SULFATE 325 (65 FE) MG PO TABS: 325.0000 mg | ORAL_TABLET | ORAL | 2 refills | Status: AC

## 2023-07-06 ENCOUNTER — Encounter: Payer: Self-pay | Admitting: Obstetrics & Gynecology

## 2023-07-06 ENCOUNTER — Ambulatory Visit: Payer: Medicaid Other | Admitting: Obstetrics & Gynecology

## 2023-07-06 VITALS — BP 128/75 | HR 83 | Wt 180.0 lb

## 2023-07-06 DIAGNOSIS — Z98891 History of uterine scar from previous surgery: Secondary | ICD-10-CM

## 2023-07-06 DIAGNOSIS — Q270 Congenital absence and hypoplasia of umbilical artery: Secondary | ICD-10-CM

## 2023-07-06 DIAGNOSIS — Z3480 Encounter for supervision of other normal pregnancy, unspecified trimester: Secondary | ICD-10-CM

## 2023-07-06 DIAGNOSIS — Z3A30 30 weeks gestation of pregnancy: Secondary | ICD-10-CM

## 2023-07-06 DIAGNOSIS — Z3483 Encounter for supervision of other normal pregnancy, third trimester: Secondary | ICD-10-CM

## 2023-07-06 NOTE — Progress Notes (Signed)
   LOW-RISK PREGNANCY VISIT Patient name: Beverly Leon MRN [id number]  Date of birth: 1986-03-09 Chief Complaint:   Routine Prenatal Visit  History of Present Illness:   Beverly Leon is a 38 y.o. [id number] female at [redacted]w[redacted]d with an Estimated Date of Delivery: 09/14/23 being seen today for ongoing management of a low-risk pregnancy.     06/22/2023    9:23 AM 03/28/2023   10:06 AM 10/19/2022    9:31 AM 03/08/2018    9:31 AM  Depression screen PHQ 2/9  Decreased Interest 1 1 0 0  Down, Depressed, Hopeless 0 0 0 0  PHQ - 2 Score 1 1 0 0  Altered sleeping 0 2 0   Tired, decreased energy 2 2 1    Change in appetite 0 0 0   Feeling bad or failure about yourself  0 0 1   Trouble concentrating 1 2 1    Moving slowly or fidgety/restless 0 0 0   Suicidal thoughts 0 0 0   PHQ-9 Score 4 7 3      Today she reports no complaints. Contractions: Not present.  .  Movement: Present. denies leaking of fluid. Review of Systems:   Pertinent items are noted in HPI Denies abnormal vaginal discharge w/ itching/odor/irritation, headaches, visual changes, shortness of breath, chest pain, abdominal pain, severe nausea/vomiting, or problems with urination or bowel movements unless otherwise stated above. Pertinent History Reviewed:  Reviewed past medical,surgical, social, obstetrical and family history.  Reviewed problem list, medications and allergies. Physical Assessment:   Vitals:   07/06/23 1417  BP: 128/75  Pulse: 83  Weight: 180 lb (81.6 kg)  Body mass index is 30.9 kg/m.        Physical Examination:   General appearance: Well appearing, and in no distress  Mental status: Alert, oriented to person, place, and time  Skin: Warm & dry  Cardiovascular: Normal heart rate noted  Respiratory: Normal respiratory effort, no distress  Abdomen: Soft, gravid, nontender  Pelvic: Cervical exam deferred         Extremities:    Fetal Status: Fetal Heart Rate (bpm): 150 Fundal Height: 30 cm Movement: Present  Presentation: Undeterminable  Chaperone: n/a    No results found for this or any previous visit (from the past 24 hours).  Assessment & Plan:  1) Low-risk pregnancy [id number] at [redacted]w[redacted]d with an Estimated Date of Delivery: 09/14/23   2) rLTCS + BTS scheduled for 09/12/23 LHE,    Meds: No orders of the defined types were placed in this encounter.  Labs/procedures today:   Plan:  Continue routine obstetrical care  Next visit: prefers in person      Follow-up: Return for keep scheduled.  No orders of the defined types were placed in this encounter.   Vonn VEAR Inch, MD 07/06/2023 2:46 PM

## 2023-07-17 ENCOUNTER — Other Ambulatory Visit: Payer: Self-pay | Admitting: Obstetrics & Gynecology

## 2023-07-17 DIAGNOSIS — O09899 Supervision of other high risk pregnancies, unspecified trimester: Secondary | ICD-10-CM

## 2023-07-19 ENCOUNTER — Encounter: Payer: Self-pay | Admitting: Women's Health

## 2023-07-19 ENCOUNTER — Ambulatory Visit: Payer: Medicaid Other | Admitting: Women's Health

## 2023-07-19 ENCOUNTER — Ambulatory Visit: Payer: Medicaid Other | Admitting: Radiology

## 2023-07-19 VITALS — BP 99/67 | HR 75 | Wt 181.4 lb

## 2023-07-19 DIAGNOSIS — Q270 Congenital absence and hypoplasia of umbilical artery: Secondary | ICD-10-CM | POA: Diagnosis not present

## 2023-07-19 DIAGNOSIS — O09893 Supervision of other high risk pregnancies, third trimester: Secondary | ICD-10-CM

## 2023-07-19 DIAGNOSIS — Z3480 Encounter for supervision of other normal pregnancy, unspecified trimester: Secondary | ICD-10-CM

## 2023-07-19 DIAGNOSIS — Z6791 Unspecified blood type, Rh negative: Secondary | ICD-10-CM | POA: Diagnosis not present

## 2023-07-19 DIAGNOSIS — Z3483 Encounter for supervision of other normal pregnancy, third trimester: Secondary | ICD-10-CM

## 2023-07-19 DIAGNOSIS — Z3A31 31 weeks gestation of pregnancy: Secondary | ICD-10-CM

## 2023-07-19 DIAGNOSIS — O09899 Supervision of other high risk pregnancies, unspecified trimester: Secondary | ICD-10-CM

## 2023-07-19 NOTE — Patient Instructions (Addendum)
 Beverly Leon, thank you for choosing our office today! We appreciate the opportunity to meet your healthcare needs. You may receive a short survey by mail, e-mail, or through [employer]. If you are happy with your care we would appreciate if you could take just a few minutes to complete the survey questions. We read all of your comments and take your feedback very seriously. Thank you again for choosing our office.  Center for [employer] Team at [facility]  [facility] at [facility] ([address] [place], [place] [address]) Entrance C, located off of [address] Free 24/7 valet parking   For your lower back pain you may: Purchase a pregnancy/maternity support belt from [employer], [employer], [employer], [employer], etc and wear it while you are up and about Take warm baths Use a heating pad to your lower back for no longer than 20 minutes at a time, and do not place near abdomen Take tylenol  as needed. Please follow directions on the bottle Kinesiology tape (can get from sporting goods store), google how to tape belly for pregnancy   CLASSES: Go to [email or web address] to register for classes (childbirth, breastfeeding, waterbirth, infant CPR, daddy bootcamp, etc.)  Call the office [phone]) or go to [facility] if: You begin to have strong, frequent contractions Your water  breaks.  Sometimes it is a big gush of fluid, sometimes it is just a trickle that keeps getting your panties wet or running down your legs You have vaginal bleeding.  It is normal to have a small amount of spotting if your cervix was checked.  You don't feel your baby moving like normal.  If you don't, get you something to eat and drink and lay down and focus on feeling your baby move.   If your baby is still not moving like normal, you should call the office or go to [facility].  Call the office [phone]) or go to [facility] hospital for these signs of pre-eclampsia: Severe headache that does  not go away with Tylenol  Visual changes- seeing spots, double, blurred vision Pain under your right breast or upper abdomen that does not go away with Tums or heartburn medicine Nausea and/or vomiting Severe swelling in your hands, feet, and face   Tdap Vaccine It is recommended that you get the Tdap vaccine during the third trimester of EACH pregnancy to help protect your baby from getting pertussis (whooping cough) 27-36 weeks is the BEST time to do this so that you can pass the protection on to your baby. During pregnancy is better than after pregnancy, but if you are unable to get it during pregnancy it will be offered at the hospital.  You can get this vaccine with us , at the health department, your family doctor, or some local pharmacies Everyone who will be around your baby should also be up-to-date on their vaccines before the baby comes. Adults (who are not pregnant) only need 1 dose of Tdap during adulthood.   [facility] Pediatricians/Family Doctors [facility]): [address] Meg Spina, [phone]           [facility]: [address], [phone]                [facility] Medicine [facility]): [address], [phone] (call to ask if accepting patients) [facility] Department: [address], [place], [phone]    [facility] Pediatricians/Family Doctors [facility]): [phone] [address], [phone]  [facility]: [address] [place], [phone] [facility]: [address], [phone]  [facility] Doctors  [facility]): [phone] [facility]: [address] [phone]   [facility] Doctors [facility]: [address], [phone]  [facility] Doctors  [facility]: [phone], [phone]  Home Blood Pressure Monitoring for Patients    Your provider has recommended that you check your blood pressure (BP) at least once a week at home. If you do not have a blood pressure cuff at home, one will be provided for you. Contact your provider if you have not received your monitor within 1 week.   Helpful Tips for Accurate Home Blood Pressure Checks  Don't smoke, exercise, or drink caffeine 30 minutes before checking your BP Use the restroom before checking your BP (a full bladder can raise your pressure) Relax in a comfortable upright chair Feet on the ground Left arm resting comfortably on a flat surface at the level of your heart Legs uncrossed Back supported Sit quietly and don't talk Place the cuff on your bare arm Adjust snuggly, so that only two fingertips can fit between your skin and the top of the cuff Check 2 readings separated by at least one minute Keep a log of your BP readings For a visual, please reference this diagram: http://ccnc.care/bpdiagram  Provider Name: [facility]     Phone: [phone]  Zone 1: ALL CLEAR  Continue to monitor your symptoms:  BP reading is less than 140 (top number) or less than 90 (bottom number)  No right upper stomach pain No headaches or seeing spots No feeling nauseated or throwing up No swelling in face and hands  Zone 2: CAUTION Call your doctor's office for any of the following:  BP reading is greater than 140 (top number) or greater than 90 (bottom number)  Stomach pain under your ribs in the middle or right side Headaches or seeing spots Feeling nauseated or throwing up Swelling in face and hands  Zone 3: EMERGENCY  Seek immediate medical care if you have any of the following:  BP reading is greater than160 (top number) or greater than 110 (bottom number) Severe headaches not improving with Tylenol  Serious difficulty catching your breath Any worsening symptoms from Zone 2  Preterm Labor and Birth Information  The normal length of a pregnancy is 39-41  weeks. Preterm labor is when labor starts before 37 completed weeks of pregnancy. What are the risk factors for preterm labor? Preterm labor is more likely to occur in women who: Have certain infections during pregnancy such as a bladder infection, sexually transmitted infection, or infection inside the uterus (chorioamnionitis). Have a shorter-than-normal cervix. Have gone into preterm labor before. Have had surgery on their cervix. Are younger than age 60 or older than age 50. Are African American. Are pregnant with twins or multiple babies (multiple gestation). Take street drugs or smoke while pregnant. Do not gain enough weight while pregnant. Became pregnant shortly after having been pregnant. What are the symptoms of preterm labor? Symptoms of preterm labor include: Cramps similar to those that can happen during a menstrual period. The cramps may happen with diarrhea. Pain in the abdomen or lower back. Regular uterine contractions that may feel like tightening of the abdomen. A feeling of increased pressure in the pelvis. Increased watery or bloody mucus discharge from the vagina. Water  breaking (ruptured amniotic sac). Why is it  important to recognize signs of preterm labor? It is important to recognize signs of preterm labor because babies who are born prematurely may not be fully developed. This can put them at an increased risk for: Long-term (chronic) heart and lung problems. Difficulty immediately after birth with regulating body systems, including blood sugar, body temperature, heart rate, and breathing rate. Bleeding in the brain. Cerebral palsy. Learning difficulties. Death. These risks are highest for babies who are born before 34 weeks of pregnancy. How is preterm labor treated? Treatment depends on the length of your pregnancy, your condition, and the health of your baby. It may involve: Having a stitch (suture) placed in your cervix to prevent your cervix from  opening too early (cerclage). Taking or being given medicines, such as: Hormone medicines. These may be given early in pregnancy to help support the pregnancy. Medicine to stop contractions. Medicines to help mature the baby's lungs. These may be prescribed if the risk of delivery is high. Medicines to prevent your baby from developing cerebral palsy. If the labor happens before 34 weeks of pregnancy, you may need to stay in the hospital. What should I do if I think I am in preterm labor? If you think that you are going into preterm labor, call your health care provider right away. How can I prevent preterm labor in future pregnancies? To increase your chance of having a full-term pregnancy: Do not use any tobacco products, such as cigarettes, chewing tobacco, and e-cigarettes. If you need help quitting, ask your health care provider. Do not use street drugs or medicines that have not been prescribed to you during your pregnancy. Talk with your health care provider before taking any herbal supplements, even if you have been taking them regularly. Make sure you gain a healthy amount of weight during your pregnancy. Watch for infection. If you think that you might have an infection, get it checked right away. Make sure to tell your health care provider if you have gone into preterm labor before. This information is not intended to replace advice given to you by your health care provider. Make sure you discuss any questions you have with your health care provider. Document Revised: 10/12/2018 Document Reviewed: 11/11/2015 Elsevier Patient Education  2020 [employer].

## 2023-07-19 NOTE — Progress Notes (Signed)
 LOW-RISK PREGNANCY VISIT Patient name: Beverly Leon MRN [id number]  Date of birth: 1986/06/05 Chief Complaint:   Routine Prenatal Visit and Pregnancy Ultrasound (Rhogam)  History of Present Illness:   Beverly Leon is a 38 y.o. [id number] female at [redacted]w[redacted]d with an Estimated Date of Delivery: 09/14/23 being seen today for ongoing management of a low-risk pregnancy.   Today she reports heartburn/reflux and low back pain. Contractions: Not present.  .  Movement: Present. denies leaking of fluid.     06/22/2023    9:23 AM 03/28/2023   10:06 AM 10/19/2022    9:31 AM 03/08/2018    9:31 AM  Depression screen PHQ 2/9  Decreased Interest 1 1 0 0  Down, Depressed, Hopeless 0 0 0 0  PHQ - 2 Score 1 1 0 0  Altered sleeping 0 2 0   Tired, decreased energy 2 2 1    Change in appetite 0 0 0   Feeling bad or failure about yourself  0 0 1   Trouble concentrating 1 2 1    Moving slowly or fidgety/restless 0 0 0   Suicidal thoughts 0 0 0   PHQ-9 Score 4 7 3          03/28/2023   10:07 AM 10/19/2022    9:32 AM  GAD 7 : Generalized Anxiety Score  Nervous, Anxious, on Edge 0 0  Control/stop worrying 0 0  Worry too much - different things 0 0  Trouble relaxing 0 0  Restless 1 0  Easily annoyed or irritable 1 1  Afraid - awful might happen 0 0  Total GAD 7 Score 2 1      Review of Systems:   Pertinent items are noted in HPI Denies abnormal vaginal discharge w/ itching/odor/irritation, headaches, visual changes, shortness of breath, chest pain, abdominal pain, severe nausea/vomiting, or problems with urination or bowel movements unless otherwise stated above. Pertinent History Reviewed:  Reviewed past medical,surgical, social, obstetrical and family history.  Reviewed problem list, medications and allergies. Physical Assessment:   Vitals:   07/19/23 1100  BP: 99/67  Pulse: 75  Weight: 181 lb 6.4 oz (82.3 kg)  Body mass index is 31.14 kg/m.        Physical Examination:   General  appearance: Well appearing, and in no distress  Mental status: Alert, oriented to person, place, and time  Skin: Warm & dry  Cardiovascular: Normal heart rate noted  Respiratory: Normal respiratory effort, no distress  Abdomen: Soft, gravid, nontender  Pelvic: Cervical exam deferred         Extremities: Edema: None  Fetal Status: Fetal Heart Rate (bpm): +u/s   Movement: Present   US :  GA = 31+6 weeks Single active female fetus,  cephalic,  FHR = 433 bpm  anterior pl,  gr1,  AFI = 15.5 cm, 57%,  MVP = 4.6 cm   EFW 51%,  1933g   2VC  Chaperone: N/A   No results found for this or any previous visit (from the past 24 hours).  Assessment & Plan:  1) Low-risk pregnancy [id number] at [redacted]w[redacted]d with an Estimated Date of Delivery: 09/14/23   2) Prev c/s x3, for RCS 3/11 w/ BTS   3) SUA> EFW 51% today  4) Reflux> try TUMS  5) Low back pain> gave printed prevention/relief measures    Meds: No orders of the defined types were placed in this encounter.  Labs/procedures today: U/S, Rhogam  Plan:  Continue routine obstetrical care  Next  visit: prefers in person    Reviewed: Preterm labor symptoms and general obstetric precautions including but not limited to vaginal bleeding, contractions, leaking of fluid and fetal movement were reviewed in detail with the patient.  All questions were answered. Does have home bp cuff. Office bp cuff given: not applicable. Check bp weekly, let us  know if consistently >140 and/or >90.  Follow-up: Return in about 2 weeks (around 08/02/2023) for LROB, CNM, in person; then 4wks from now EFW u/s and LROB w/ CNM.  Future Appointments  Date Time Provider Department Center  08/02/2023 11:10 AM Jolayne Natter, CNM [facility]  08/16/2023 10:45 AM [facility]  [facility] None  08/16/2023 11:30 AM Ferd Householder, CNM [facility]    Orders Placed This Encounter  Procedures   RHO (D) Immune Globulin    Ferd Householder CNM, [facility] 07/19/2023 11:32 AM

## 2023-07-19 NOTE — Progress Notes (Signed)
 US :  GA = 31+6 weeks Single active female fetus,  cephalic,  FHR = 409 bpm  anterior pl,  gr1,  AFI = 15.5 cm, 57%,  MVP = 4.6 cm   EFW 51%,  1933g   2VC

## 2023-08-02 ENCOUNTER — Ambulatory Visit (INDEPENDENT_AMBULATORY_CARE_PROVIDER_SITE_OTHER): Payer: Medicaid Other | Admitting: Advanced Practice Midwife

## 2023-08-02 VITALS — BP 116/73 | HR 86 | Wt 178.0 lb

## 2023-08-02 DIAGNOSIS — O26893 Other specified pregnancy related conditions, third trimester: Secondary | ICD-10-CM

## 2023-08-02 DIAGNOSIS — Z3480 Encounter for supervision of other normal pregnancy, unspecified trimester: Secondary | ICD-10-CM

## 2023-08-02 DIAGNOSIS — Z3A33 33 weeks gestation of pregnancy: Secondary | ICD-10-CM

## 2023-08-02 DIAGNOSIS — Z98891 History of uterine scar from previous surgery: Secondary | ICD-10-CM

## 2023-08-02 DIAGNOSIS — O09523 Supervision of elderly multigravida, third trimester: Secondary | ICD-10-CM

## 2023-08-02 DIAGNOSIS — Z6791 Unspecified blood type, Rh negative: Secondary | ICD-10-CM

## 2023-08-02 DIAGNOSIS — O09529 Supervision of elderly multigravida, unspecified trimester: Secondary | ICD-10-CM | POA: Insufficient documentation

## 2023-08-02 NOTE — Progress Notes (Addendum)
   LOW-RISK PREGNANCY VISIT Patient name: Beverly Leon MRN [id number]  Date of birth: August 14, 1985 Chief Complaint:   Routine Prenatal Visit  History of Present Illness:   Beverly Leon is a 38 y.o. [id number] female at [redacted]w[redacted]d with an Estimated Date of Delivery: 09/14/23 being seen today for ongoing management of a low-risk pregnancy.  Today she reports no complaints. Contractions: Not present. Vag. Bleeding: None.  Movement: Present. denies leaking of fluid. Review of Systems:   Pertinent items are noted in HPI Denies abnormal vaginal discharge w/ itching/odor/irritation, headaches, visual changes, shortness of breath, chest pain, abdominal pain, severe nausea/vomiting, or problems with urination or bowel movements unless otherwise stated above. Pertinent History Reviewed:  Reviewed past medical,surgical, social, obstetrical and family history.  Reviewed problem list, medications and allergies. Physical Assessment:   Vitals:   08/02/23 1117  BP: 116/73  Pulse: 86  Weight: 178 lb (80.7 kg)  Body mass index is 30.55 kg/m.        Physical Examination:   General appearance: Well appearing, and in no distress  Mental status: Alert, oriented to person, place, and time  Skin: Warm & dry  Cardiovascular: Normal heart rate noted  Respiratory: Normal respiratory effort, no distress  Abdomen: Soft, gravid, nontender  Pelvic: Cervical exam deferred         Extremities:    Fetal Status: Fetal Heart Rate (bpm): 150 Fundal Height: 34 cm Movement: Present    No results found for this or any previous visit (from the past 24 hours).  Assessment & Plan:  1) Low-risk pregnancy [id number] at [redacted]w[redacted]d with an Estimated Date of Delivery: 09/14/23   2) Prev C/S  3, for repeat w LHE 3/11  3) SUA, growth sched for 36wks  4) Wants BTL, 30d papers under media (signed 06/22/23)   Meds: No orders of the defined types were placed in this encounter.  Labs/procedures today: none  Plan:  Continue routine  obstetrical care   Reviewed: Preterm labor symptoms and general obstetric precautions including but not limited to vaginal bleeding, contractions, leaking of fluid and fetal movement were reviewed in detail with the patient.  All questions were answered. Has home bp cuff. Check bp weekly, let us know if >140/90.   Follow-up: Return for As scheduled (visit , u/s; also GBS/cultures); pls look for BTL 30d under media (sticky says 12/19).  No orders of the defined types were placed in this encounter.  Arabella Merles CNM 08/02/2023 11:35 AM

## 2023-08-15 ENCOUNTER — Other Ambulatory Visit: Payer: Self-pay | Admitting: Advanced Practice Midwife

## 2023-08-15 DIAGNOSIS — Q270 Congenital absence and hypoplasia of umbilical artery: Secondary | ICD-10-CM

## 2023-08-16 ENCOUNTER — Ambulatory Visit (INDEPENDENT_AMBULATORY_CARE_PROVIDER_SITE_OTHER): Payer: Medicaid Other

## 2023-08-16 ENCOUNTER — Other Ambulatory Visit (HOSPITAL_COMMUNITY)
Admission: RE | Admit: 2023-08-16 | Discharge: 2023-08-16 | Disposition: A | Discharge status: Home / Self Care | Payer: Medicaid Other | Source: Ambulatory Visit | Attending: Women's Health | Admitting: Women's Health

## 2023-08-16 ENCOUNTER — Ambulatory Visit (INDEPENDENT_AMBULATORY_CARE_PROVIDER_SITE_OTHER): Payer: Medicaid Other | Admitting: Women's Health

## 2023-08-16 ENCOUNTER — Encounter: Payer: Self-pay | Admitting: Women's Health

## 2023-08-16 VITALS — BP 115/79 | HR 83 | Wt 183.0 lb

## 2023-08-16 DIAGNOSIS — Z3483 Encounter for supervision of other normal pregnancy, third trimester: Secondary | ICD-10-CM | POA: Diagnosis present

## 2023-08-16 DIAGNOSIS — Z3480 Encounter for supervision of other normal pregnancy, unspecified trimester: Secondary | ICD-10-CM

## 2023-08-16 DIAGNOSIS — Q270 Congenital absence and hypoplasia of umbilical artery: Secondary | ICD-10-CM

## 2023-08-16 DIAGNOSIS — Z3A35 35 weeks gestation of pregnancy: Secondary | ICD-10-CM | POA: Insufficient documentation

## 2023-08-16 NOTE — Progress Notes (Signed)
[name] 35+6 wks,cephalic,anterior placenta gr 0,AFI 19 cm,FHR 132 bpm,single umbilical artery,EFW 2845 g 57%

## 2023-08-16 NOTE — Progress Notes (Signed)
LOW-RISK PREGNANCY VISIT Patient name: Beverly Leon MRN [id number]  Date of birth: 19-Jan-1986 Chief Complaint:   Routine Prenatal Visit (culture) and Pregnancy Ultrasound  History of Present Illness:   Beverly Leon is a 38 y.o. [id number] female at [redacted]w[redacted]d with an Estimated Date of Delivery: 09/14/23 being seen today for ongoing management of a low-risk pregnancy.   Today she reports no complaints. Contractions: Not present.  .  Movement: Present. denies leaking of fluid.     06/22/2023    9:23 AM 03/28/2023   10:06 AM 10/19/2022    9:31 AM 03/08/2018    9:31 AM  Depression screen PHQ 2/9  Decreased Interest 1 1 0 0  Down, Depressed, Hopeless 0 0 0 0  PHQ - 2 Score 1 1 0 0  Altered sleeping 0 2 0   Tired, decreased energy 2 2 1    Change in appetite 0 0 0   Feeling bad or failure about yourself  0 0 1   Trouble concentrating 1 2 1    Moving slowly or fidgety/restless 0 0 0   Suicidal thoughts 0 0 0   PHQ-9 Score 4 7 3          03/28/2023   10:07 AM 10/19/2022    9:32 AM  GAD 7 : Generalized Anxiety Score  Nervous, Anxious, on Edge 0 0  Control/stop worrying 0 0  Worry too much - different things 0 0  Trouble relaxing 0 0  Restless 1 0  Easily annoyed or irritable 1 1  Afraid - awful might happen 0 0  Total GAD 7 Score 2 1      Review of Systems:   Pertinent items are noted in HPI Denies abnormal vaginal discharge w/ itching/odor/irritation, headaches, visual changes, shortness of breath, chest pain, abdominal pain, severe nausea/vomiting, or problems with urination or bowel movements unless otherwise stated above. Pertinent History Reviewed:  Reviewed past medical,surgical, social, obstetrical and family history.  Reviewed problem list, medications and allergies. Physical Assessment:   Vitals:   08/16/23 1136  BP: 115/79  Pulse: 83  Weight: 183 lb (83 kg)  Body mass index is 31.41 kg/m.        Physical Examination:   General appearance: Well appearing, and  in no distress  Mental status: Alert, oriented to person, place, and time  Skin: Warm & dry  Cardiovascular: Normal heart rate noted  Respiratory: Normal respiratory effort, no distress  Abdomen: Soft, gravid, nontender  Pelvic: Cervical exam performed         Extremities: Edema: None  Fetal Status:     Movement: Present  Beverly Leon 35+6 wks,cephalic,anterior placenta gr 0,AFI 19 cm,FHR 132 bpm,single umbilical artery,EFW 2845 g 57%   Chaperone: Peggy Dones   No results found for this or any previous visit (from the past 24 hours).  Assessment & Plan:  1) Low-risk pregnancy G4P3004 at [redacted]w[redacted]d with an Estimated Date of Delivery: 09/14/23   2) Prev c/s x 3, for RCS 3/11 w/ BTS  3) SUA> EFW 57% today   Meds: No orders of the defined types were placed in this encounter.  Labs/procedures today: GBS, GC/CT, SVE, and U/S  Plan:  Continue routine obstetrical care  Next visit: prefers in person    Reviewed: Preterm labor symptoms and general obstetric precautions including but not limited to vaginal bleeding, contractions, leaking of fluid and fetal movement were reviewed in detail with the patient.  All questions were answered. Does have home bp cuff.  Office bp cuff given: not applicable. Check bp weekly, let Beverly Leon know if consistently >140 and/or >90.  Follow-up: Return for LROB, weekly , CNM.  No future appointments.  Orders Placed This Encounter  Procedures   Culture, beta strep (group b only)   Cheral Marker CNM, [facility] 08/16/2023 11:50 AM

## 2023-08-16 NOTE — Patient Instructions (Signed)
Carollee Herter, thank you for choosing our office today! We appreciate the opportunity to meet your healthcare needs. You may receive a short survey by mail, e-mail, or through [employer]. If you are happy with your care we would appreciate if you could take just a few minutes to complete the survey questions. We read all of your comments and take your feedback very seriously. Thank you again for choosing our office.  Center for [employer] Team at [facility] ([facility]Rh)  [facility] at [facility] ([address] [place], [place] [address]) Entrance C, located off of [address] Free 24/7 valet parking   CLASSES: Go to [email or web address] to register for classes (childbirth, breastfeeding, waterbirth, infant CPR, daddy bootcamp, etc.)  Call the office [phone]) or go to [facility] if: You begin to have strong, frequent contractions Your water breaks.  Sometimes it is a big gush of fluid, sometimes it is just a trickle that keeps getting your panties wet or running down your legs You have vaginal bleeding.  It is normal to have a small amount of spotting if your cervix was checked.  You don't feel your baby moving like normal.  If you don't, get you something to eat and drink and lay down and focus on feeling your baby move.   If your baby is still not moving like normal, you should call the office or go to [facility]  Call the office [phone]) or go to [facility] hospital for these signs of pre-eclampsia: Severe headache that does not go away with Tylenol Visual changes- seeing spots, double, blurred vision Pain under your right breast or upper abdomen that does not go away with Tums or heartburn medicine Nausea and/or vomiting Severe swelling in your hands, feet, and face   Tdap Vaccine It is recommended that you get the Tdap vaccine during the third trimester of EACH pregnancy to help protect your baby from getting pertussis (whooping cough) 27-36 weeks is the BEST time to do  this so that you can pass the protection on to your baby. During pregnancy is better than after pregnancy, but if you are unable to get it during pregnancy it will be offered at the hospital.  You can get this vaccine with [name], at the health department, your family doctor, or some local pharmacies Everyone who will be around your baby should also be up-to-date on their vaccines before the baby comes. Adults (who are not pregnant) only need 1 dose of Tdap during adulthood.   [facility] Pediatricians/Family Doctors [facility]): [address] Colette Ribas, [phone]           [facility]: [address], [phone]                [facility]): [address], [phone] (call to ask if accepting patients) [facility] Department: [address], [place], [phone]    [facility] Pediatricians/Family Doctors [facility]): [phone], [phone] [facility]: [address] [place], [phone] [facility] [place]: [address], [phone]  [facility] Doctors  [facility]): [phone] [facility]: [address] [phone]   [facility] Doctors [facility]: [address] [phone]  [facility] Family Doctors  [facility]: [phone], [phone]  Home Blood Pressure Monitoring for Patients   Your provider has recommended that you check your  blood pressure (BP) at least once a week at home. If you do not have a blood pressure cuff at home, one will be provided for you. Contact your provider if you have not received your monitor within 1 week.   Helpful Tips for Accurate Home Blood Pressure Checks  Don't smoke, exercise, or drink caffeine 30 minutes before checking your BP Use the restroom before checking your BP (a full bladder can raise your  pressure) Relax in a comfortable upright chair Feet on the ground Left arm resting comfortably on a flat surface at the level of your heart Legs uncrossed Back supported Sit quietly and don't talk Place the cuff on your bare arm Adjust snuggly, so that only two fingertips can fit between your skin and the top of the cuff Check 2 readings separated by at least one minute Keep a log of your BP readings For a visual, please reference this diagram: http://ccnc.care/bpdiagram  Provider Name: [facility]     Phone: [phone]  Zone 1: ALL CLEAR  Continue to monitor your symptoms:  BP reading is less than 140 (top number) or less than 90 (bottom number)  No right upper stomach pain No headaches or seeing spots No feeling nauseated or throwing up No swelling in face and hands  Zone 2: CAUTION Call your doctor's office for any of the following:  BP reading is greater than 140 (top number) or greater than 90 (bottom number)  Stomach pain under your ribs in the middle or right side Headaches or seeing spots Feeling nauseated or throwing up Swelling in face and hands  Zone 3: EMERGENCY  Seek immediate medical care if you have any of the following:  BP reading is greater than160 (top number) or greater than 110 (bottom number) Severe headaches not improving with Tylenol Serious difficulty catching your breath Any worsening symptoms from Zone 2  Preterm Labor and Birth Information  The normal length of a pregnancy is 39-41 weeks. Preterm labor is when labor starts before 37 completed weeks of pregnancy. What are the risk factors for preterm labor? Preterm labor is more likely to occur in women who: Have certain infections during pregnancy such as a bladder infection, sexually transmitted infection, or infection inside the uterus (chorioamnionitis). Have a shorter-than-normal cervix. Have gone into preterm labor before. Have had surgery on their cervix. Are younger than age 52  or older than age 71. Are African American. Are pregnant with twins or multiple babies (multiple gestation). Take street drugs or smoke while pregnant. Do not gain enough weight while pregnant. Became pregnant shortly after having been pregnant. What are the symptoms of preterm labor? Symptoms of preterm labor include: Cramps similar to those that can happen during a menstrual period. The cramps may happen with diarrhea. Pain in the abdomen or lower back. Regular uterine contractions that may feel like tightening of the abdomen. A feeling of increased pressure in the pelvis. Increased watery or bloody mucus discharge from the vagina. Water breaking (ruptured amniotic sac). Why is it important to recognize signs of preterm labor? It is important to recognize signs of preterm labor because babies who are born prematurely may not be fully developed. This can put them at an increased risk for: Long-term (chronic) heart and lung problems. Difficulty immediately after birth with regulating body systems, including blood sugar, body temperature, heart rate, and breathing rate. Bleeding in the brain. Cerebral palsy. Learning difficulties. Death. These risks are highest for babies who are born before 34 weeks  of pregnancy. How is preterm labor treated? Treatment depends on the length of your pregnancy, your condition, and the health of your baby. It may involve: Having a stitch (suture) placed in your cervix to prevent your cervix from opening too early (cerclage). Taking or being given medicines, such as: Hormone medicines. These may be given early in pregnancy to help support the pregnancy. Medicine to stop contractions. Medicines to help mature the babys lungs. These may be prescribed if the risk of delivery is high. Medicines to prevent your baby from developing cerebral palsy. If the labor happens before 34 weeks of pregnancy, you may need to stay in the hospital. What should I do if I  think I am in preterm labor? If you think that you are going into preterm labor, call your health care provider right away. How can I prevent preterm labor in future pregnancies? To increase your chance of having a full-term pregnancy: Do not use any tobacco products, such as cigarettes, chewing tobacco, and e-cigarettes. If you need help quitting, ask your health care provider. Do not use street drugs or medicines that have not been prescribed to you during your pregnancy. Talk with your health care provider before taking any herbal supplements, even if you have been taking them regularly. Make sure you gain a healthy amount of weight during your pregnancy. Watch for infection. If you think that you might have an infection, get it checked right away. Make sure to tell your health care provider if you have gone into preterm labor before. This information is not intended to replace advice given to you by your health care provider. Make sure you discuss any questions you have with your health care provider. Document Revised: 10/12/2018 Document Reviewed: 11/11/2015 Elsevier Patient Education  2020 [employer].

## 2023-08-17 LAB — CERVICOVAGINAL ANCILLARY ONLY
Chlamydia: NEGATIVE
Comment: NEGATIVE
Comment: NORMAL
Neisseria Gonorrhea: NEGATIVE

## 2023-08-20 LAB — CULTURE, BETA STREP (GROUP B ONLY): Strep Gp B Culture: NEGATIVE

## 2023-08-23 ENCOUNTER — Ambulatory Visit: Payer: Medicaid Other | Admitting: Advanced Practice Midwife

## 2023-08-23 VITALS — BP 126/87 | HR 96 | Wt 186.0 lb

## 2023-08-23 DIAGNOSIS — Z3A36 36 weeks gestation of pregnancy: Secondary | ICD-10-CM

## 2023-08-23 DIAGNOSIS — Z3483 Encounter for supervision of other normal pregnancy, third trimester: Secondary | ICD-10-CM

## 2023-08-23 NOTE — Progress Notes (Signed)
   LOW-RISK PREGNANCY VISIT Patient name: Beverly Leon MRN [id number]  Date of birth: 1986/02/25 Chief Complaint:   Routine Prenatal Visit  History of Present Illness:   Beverly Leon is a 38 y.o. [id number] female at [redacted]w[redacted]d with an Estimated Date of Delivery: 09/14/23 being seen today for ongoing management of a low-risk pregnancy.  Today she reports  pelvic pressure . Contractions: Irritability. Vag. Bleeding: None.  Movement: Present. denies leaking of fluid. Review of Systems:   Pertinent items are noted in HPI Denies abnormal vaginal discharge w/ itching/odor/irritation, headaches, visual changes, shortness of breath, chest pain, abdominal pain, severe nausea/vomiting, or problems with urination or bowel movements unless otherwise stated above. Pertinent History Reviewed:  Reviewed past medical,surgical, social, obstetrical and family history.  Reviewed problem list, medications and allergies. Physical Assessment:   Vitals:   08/23/23 1114  BP: 126/87  Pulse: 96  Weight: 186 lb (84.4 kg)  Body mass index is 31.93 kg/m.        Physical Examination:   General appearance: Well appearing, and in no distress  Mental status: Alert, oriented to person, place, and time  Skin: Warm & dry  Cardiovascular: Normal heart rate noted  Respiratory: Normal respiratory effort, no distress  Abdomen: Soft, gravid, nontender  Pelvic: Cervical exam deferred         Extremities:    Fetal Status:     Movement: Present    No results found for this or any previous visit (from the past 24 hours).  Assessment & Plan:  1) Low-risk pregnancy [id number] at [redacted]w[redacted]d with an Estimated Date of Delivery: 09/14/23   2) Prev C/S x 3, for RCS w BTS 09/12/23  3) SUA, EFW 57% @ 36wks   Meds: No orders of the defined types were placed in this encounter.  Labs/procedures today: none  Plan:  Continue routine obstetrical care   Reviewed: Term labor symptoms and general obstetric precautions including but not  limited to vaginal bleeding, contractions, leaking of fluid and fetal movement were reviewed in detail with the patient.  All questions were answered. Has home bp cuff. Check bp weekly, let us know if >140/90.   Follow-up: Return for As scheduled.  No orders of the defined types were placed in this encounter.  Arabella Merles CNM 08/23/2023 11:31 AM

## 2023-08-29 NOTE — Patient Instructions (Signed)
 Beverly Leon  08/29/2023   Your procedure is scheduled on:  09/12/2023  Arrive at 0530 at [employer] C on [employer] at [facility]  and [employer]. You are invited to use the FREE valet parking or use the Visitor's parking deck.  Pick up the phone at the desk and dial [phone].  Call this number if you have problems the morning of surgery: [phone]  Remember:   Do not eat food:(After Midnight) Desps de medianoche.  You may drink clear liquids until arrival at ___0530__.  Clear liquids means a liquid you can see thru.  It can have color such as Cola or Kool aid.  Tea is OK and coffee as long as no milk or creamer of any kind.  Take these medicines the morning of surgery with A SIP OF WATER:  none   Do not wear jewelry, make-up or nail polish.  Do not wear lotions, powders, or perfumes. Do not wear deodorant.  Do not shave 48 hours prior to surgery.  Do not bring valuables to the hospital.  [facility] is not   responsible for any belongings or valuables brought to the hospital.  Contacts, dentures or bridgework may not be worn into surgery.  Leave suitcase in the car. After surgery it may be brought to your room.  For patients admitted to the hospital, checkout time is 11:00 AM the day of              discharge.      Please read over the following fact sheets that you were given:     Preparing for Surgery

## 2023-08-30 ENCOUNTER — Ambulatory Visit: Payer: Medicaid Other | Admitting: Women's Health

## 2023-08-30 ENCOUNTER — Encounter: Payer: Self-pay | Admitting: Women's Health

## 2023-08-30 VITALS — BP 132/80 | HR 98 | Wt 184.0 lb

## 2023-08-30 DIAGNOSIS — Z3A37 37 weeks gestation of pregnancy: Secondary | ICD-10-CM | POA: Diagnosis not present

## 2023-08-30 DIAGNOSIS — Z3483 Encounter for supervision of other normal pregnancy, third trimester: Secondary | ICD-10-CM | POA: Diagnosis not present

## 2023-08-30 NOTE — Patient Instructions (Signed)
 Beverly Leon, thank you for choosing our office today! We appreciate the opportunity to meet your healthcare needs. You may receive a short survey by mail, e-mail, or through [employer]. If you are happy with your care we would appreciate if you could take just a few minutes to complete the survey questions. We read all of your comments and take your feedback very seriously. Thank you again for choosing our office.  Center for [employer] Team at [place] at [facility] ([place], [place] [address]) Entrance C, located off of [place] parking   CLASSES: Go to [email or web address] to register for classes (childbirth, breastfeeding, waterbirth, infant CPR, daddy bootcamp, etc.)  Call the office [phone]) or go to [facility] if: You begin to have strong, frequent contractions Your water breaks.  Sometimes it is a big gush of fluid, sometimes it is just a trickle that keeps getting your panties wet or running down your legs You have vaginal bleeding.  It is normal to have a small amount of spotting if your cervix was checked.  You don't feel your baby moving like normal.  If you don't, get you something to eat and drink and lay down and focus on feeling your baby move.   If your baby is still not moving like normal, you should call the office or go to [facility].  Call the office [phone]) or go to [facility] hospital for these signs of pre-eclampsia: Severe headache that does not go away with Tylenol Visual changes- seeing spots, double, blurred vision Pain under your right breast or upper abdomen that does not go away with Tums or heartburn medicine Nausea and/or vomiting Severe swelling in your hands, feet, and face   [facility] Pediatricians/Family Doctors [facility]): [address] [place], [place]: [address] [place], [facility]): [place], [phone] (call to ask if accepting patients) [facility] Department: [address] [place], [place] [facility]): [address], [facility]: [address] [place], [place] [facility] [place]: [place], [place] [facility]): [phone] [facility]: [address] [place]: [address] [facility]: [phone], [phone]  Home Blood Pressure Monitoring for Patients   Your provider has recommended that you check your blood pressure (BP) at least once a week at home. If you do not have a blood pressure cuff at home, one will be provided for you. Contact your provider if you have not received your monitor within 1 week.   Helpful Tips for Accurate Home Blood Pressure Checks  Don't smoke, exercise, or drink caffeine 30 minutes before checking your BP Use the restroom before checking your BP (a full bladder can raise your pressure) Relax in a comfortable upright chair Feet on the ground Left arm resting comfortably on a flat surface at the level of your heart Legs uncrossed Back supported Sit quietly and don't talk Place the cuff on your bare arm Adjust snuggly, so that only two fingertips  can fit between your skin and the top of the cuff Check 2 readings separated by at least one minute Keep a log of your BP readings For a visual, please reference this diagram: http://ccnc.care/bpdiagram  Provider Name: [facility]     Phone: [phone]  Zone 1: ALL CLEAR  Continue to monitor your symptoms:  BP reading is less than 140 (top number) or less than 90 (bottom number)  No right  upper stomach pain No headaches or seeing spots No feeling nauseated or throwing up No swelling in face and hands  Zone 2: CAUTION Call your doctor's office for any of the following:  BP reading is greater than 140 (top number) or greater than 90 (bottom number)  Stomach pain under your ribs in the middle or right side Headaches or seeing spots Feeling nauseated or throwing up Swelling in face and hands  Zone 3: EMERGENCY  Seek immediate medical care if you have any of the following:  BP reading is greater than160 (top number) or greater than 110 (bottom number) Severe headaches not improving with Tylenol Serious difficulty catching your breath Any worsening symptoms from Zone 2   Braxton Hicks Contractions Contractions of the uterus can occur throughout pregnancy, but they are not always a sign that you are in labor. You may have practice contractions called Braxton Hicks contractions. These false labor contractions are sometimes confused with true labor. What are [place] contractions? Braxton Hicks contractions are tightening movements that occur in the muscles of the uterus before labor. Unlike true labor contractions, these contractions do not result in opening (dilation) and thinning of the cervix. Toward the end of pregnancy (32-34 weeks), Braxton Hicks contractions can happen more often and may become stronger. These contractions are sometimes difficult to tell apart from true labor because they can be very uncomfortable. You should not feel embarrassed if you go to the hospital with false labor. Sometimes, the only way to tell if you are in true labor is for your health care provider to look for changes in the cervix. The health care provider will do a physical exam and may monitor your contractions. If you are not in true labor, the exam should show that your cervix is not dilating and your water has not broken. If there are no other health problems associated with your  pregnancy, it is completely safe for you to be sent home with false labor. You may continue to have Braxton Hicks contractions until you go into true labor. How to tell the difference between true labor and false labor True labor Contractions last 30-70 seconds. Contractions become very regular. Discomfort is usually felt in the top of the uterus, and it spreads to the lower abdomen and low back. Contractions do not go away with walking. Contractions usually become more intense and increase in frequency. The cervix dilates and gets thinner. False labor Contractions are usually shorter and not as strong as true labor contractions. Contractions are usually irregular. Contractions are often felt in the front of the lower abdomen and in the groin. Contractions may go away when you walk around or change positions while lying down. Contractions get weaker and are shorter-lasting as time goes on. The cervix usually does not dilate or become thin. Follow these instructions at home:  Take over-the-counter and prescription medicines only as told by your health care provider. Keep up with your usual exercises and follow other instructions from your health care provider. Eat and drink lightly if you think  you are going into labor. If Braxton Hicks contractions are making you uncomfortable: Change your position from lying down or resting to walking, or change from walking to resting. Sit and rest in a tub of warm water. Drink enough fluid to keep your urine pale yellow. Dehydration may cause these contractions. Do slow and deep breathing several times an hour. Keep all follow-up prenatal visits as told by your health care provider. This is important. Contact a health care provider if: You have a fever. You have continuous pain in your abdomen. Get help right away if: Your contractions become stronger, more regular, and closer together. You have fluid leaking or gushing from your vagina. You pass  blood-tinged mucus (bloody show). You have bleeding from your vagina. You have low back pain that you never had before. You feel your babys head pushing down and causing pelvic pressure. Your baby is not moving inside you as much as it used to. Summary Contractions that occur before labor are called Braxton Hicks contractions, false labor, or practice contractions. Braxton Hicks contractions are usually shorter, weaker, farther apart, and less regular than true labor contractions. True labor contractions usually become progressively stronger and regular, and they become more frequent. Manage discomfort from [facility] contractions by changing position, resting in a warm bath, drinking plenty of water, or practicing deep breathing. This information is not intended to replace advice given to you by your health care provider. Make sure you discuss any questions you have with your health care provider. Document Revised: 06/02/2017 Document Reviewed: 11/03/2016 Elsevier Patient Education  [place].

## 2023-08-30 NOTE — Progress Notes (Signed)
 LOW-RISK PREGNANCY VISIT Patient name: Beverly Leon MRN [id number]  Date of birth: Feb 20, 1986 Chief Complaint:   Routine Prenatal Visit  History of Present Illness:   Beverly Leon is a 38 y.o. [id number] female at [redacted]w[redacted]d with an Estimated Date of Delivery: 09/14/23 being seen today for ongoing management of a low-risk pregnancy.   Today she reports no complaints. Contractions: Not present. Vag. Bleeding: None.  Movement: Present. denies leaking of fluid.     06/22/2023    9:23 AM 03/28/2023   10:06 AM 10/19/2022    9:31 AM 03/08/2018    9:31 AM  Depression screen PHQ 2/9  Decreased Interest 1 1 0 0  Down, Depressed, Hopeless 0 0 0 0  PHQ - 2 Score 1 1 0 0  Altered sleeping 0 2 0   Tired, decreased energy 2 2 1    Change in appetite 0 0 0   Feeling bad or failure about yourself  0 0 1   Trouble concentrating 1 2 1    Moving slowly or fidgety/restless 0 0 0   Suicidal thoughts 0 0 0   PHQ-9 Score 4 7 3          03/28/2023   10:07 AM 10/19/2022    9:32 AM  GAD 7 : Generalized Anxiety Score  Nervous, Anxious, on Edge 0 0  Control/stop worrying 0 0  Worry too much - different things 0 0  Trouble relaxing 0 0  Restless 1 0  Easily annoyed or irritable 1 1  Afraid - awful might happen 0 0  Total GAD 7 Score 2 1      Review of Systems:   Pertinent items are noted in HPI Denies abnormal vaginal discharge w/ itching/odor/irritation, headaches, visual changes, shortness of breath, chest pain, abdominal pain, severe nausea/vomiting, or problems with urination or bowel movements unless otherwise stated above. Pertinent History Reviewed:  Reviewed past medical,surgical, social, obstetrical and family history.  Reviewed problem list, medications and allergies. Physical Assessment:   Vitals:   08/30/23 1348  BP: 132/80  Pulse: 98  Weight: 184 lb (83.5 kg)  Body mass index is 31.58 kg/m.        Physical Examination:   General appearance: Well appearing, and in no  distress  Mental status: Alert, oriented to person, place, and time  Skin: Warm & dry  Cardiovascular: Normal heart rate noted  Respiratory: Normal respiratory effort, no distress  Abdomen: Soft, gravid, nontender  Pelvic: Cervical exam deferred         Extremities: Edema: None  Fetal Status: Fetal Heart Rate (bpm): 145 Fundal Height: 37 cm Movement: Present    Chaperone: N/A   No results found for this or any previous visit (from the past 24 hours).  Assessment & Plan:  1) Low-risk pregnancy G4P3004 at [redacted]w[redacted]d with an Estimated Date of Delivery: 09/14/23   2) SUA, EFW 57% @ 35.6w  3) Prev c/s x 3> for RCS w/ BTX 3/11   Meds: No orders of the defined types were placed in this encounter.  Labs/procedures today: none  Plan:  Continue routine obstetrical care  Next visit: prefers in person    Reviewed: Term labor symptoms and general obstetric precautions including but not limited to vaginal bleeding, contractions, leaking of fluid and fetal movement were reviewed in detail with the patient.  All questions were answered. Does have home bp cuff. Office bp cuff given: not applicable. Check bp weekly, let us know if consistently >140 and/or >90.  Follow-up: Return for As scheduled.  Future Appointments  Date Time Provider Department Center  09/06/2023 11:30 AM Cheral Marker, CNM [facility]  09/11/2023  9:00 AM [facility] PAT 1 [facility] None    No orders of the defined types were placed in this encounter.  Cheral Marker CNM, [facility] 08/30/2023 2:03 PM

## 2023-08-31 ENCOUNTER — Telehealth (HOSPITAL_COMMUNITY): Payer: Self-pay | Admitting: *Deleted

## 2023-08-31 ENCOUNTER — Encounter (HOSPITAL_COMMUNITY): Payer: Self-pay

## 2023-08-31 NOTE — Telephone Encounter (Signed)
 Preadmission screen

## 2023-09-04 ENCOUNTER — Encounter (HOSPITAL_COMMUNITY): Payer: Self-pay

## 2023-09-06 ENCOUNTER — Encounter: Payer: Self-pay | Admitting: Women's Health

## 2023-09-06 ENCOUNTER — Ambulatory Visit (INDEPENDENT_AMBULATORY_CARE_PROVIDER_SITE_OTHER): Payer: Medicaid Other | Admitting: Women's Health

## 2023-09-06 VITALS — BP 132/84 | HR 94 | Wt 184.0 lb

## 2023-09-06 DIAGNOSIS — Z3483 Encounter for supervision of other normal pregnancy, third trimester: Secondary | ICD-10-CM | POA: Diagnosis not present

## 2023-09-06 DIAGNOSIS — Z3A38 38 weeks gestation of pregnancy: Secondary | ICD-10-CM

## 2023-09-06 NOTE — Patient Instructions (Signed)
Beverly Leon, thank you for choosing our office today! We appreciate the opportunity to meet your healthcare needs. You may receive a short survey by mail, e-mail, or through [employer]. If you are happy with your care we would appreciate if you could take just a few minutes to complete the survey questions. We read all of your comments and take your feedback very seriously. Thank you again for choosing our office.  Center for [employer] Team at [place] at [facility] ([place], [place] [address]) Entrance C, located off of [place] parking   CLASSES: Go to [email or web address] to register for classes (childbirth, breastfeeding, waterbirth, infant CPR, daddy bootcamp, etc.)  Call the office [phone]) or go to [facility] if: You begin to have strong, frequent contractions Your water breaks.  Sometimes it is a big gush of fluid, sometimes it is just a trickle that keeps getting your panties wet or running down your legs You have vaginal bleeding.  It is normal to have a small amount of spotting if your cervix was checked.  You don't feel your baby moving like normal.  If you don't, get you something to eat and drink and lay down and focus on feeling your baby move.   If your baby is still not moving like normal, you should call the office or go to [facility].  Call the office [phone]) or go to [facility] hospital for these signs of pre-eclampsia: Severe headache that does not go away with Tylenol Visual changes- seeing spots, double, blurred vision Pain under your right breast or upper abdomen that does not go away with Tums or heartburn medicine Nausea and/or vomiting Severe swelling in your hands, feet, and face   [facility] Pediatricians/Family Doctors [facility]): [address] [place], [place]: [address] [place], [facility]): [place], [phone] (call to ask if accepting patients) [facility] Department: [address] [place], [place] [facility]): [address], [facility]: [address] [place], [place] [facility]: [place], [facility]): [phone] [facility]: [address] [place]: [address] [place]: [phone], [phone]  Home Blood Pressure Monitoring for Patients   Your provider has recommended that you check your blood pressure (BP) at least once a week at home. If you do not have a blood pressure cuff at home, one will be provided for you. Contact your provider if you have not received your monitor within 1 week.   Helpful Tips for Accurate Home Blood Pressure Checks  Don't smoke, exercise, or drink caffeine 30 minutes before checking your BP Use the restroom before checking your BP (a full bladder can raise your pressure) Relax in a comfortable upright chair Feet on the ground Left arm resting comfortably on a flat surface at the level of your heart Legs uncrossed Back supported Sit quietly and don't talk Place the cuff on your bare arm Adjust snuggly, so that only two fingertips  can fit between your skin and the top of the cuff Check 2 readings separated by at least one minute Keep a log of your BP readings For a visual, please reference this diagram: http://ccnc.care/bpdiagram  Provider Name: [facility]     Phone: [phone]  Zone 1: ALL CLEAR  Continue to monitor your symptoms:  BP reading is less than 140 (top number) or less than 90 (bottom number)  No right  upper stomach pain No headaches or seeing spots No feeling nauseated or throwing up No swelling in face and hands  Zone 2: CAUTION Call your doctor's office for any of the following:  BP reading is greater than 140 (top number) or greater than 90 (bottom number)  Stomach pain under your ribs in the middle or right side Headaches or seeing spots Feeling nauseated or throwing up Swelling in face and hands  Zone 3: EMERGENCY  Seek immediate medical care if you have any of the following:  BP reading is greater than160 (top number) or greater than 110 (bottom number) Severe headaches not improving with Tylenol Serious difficulty catching your breath Any worsening symptoms from Zone 2   Braxton Hicks Contractions Contractions of the uterus can occur throughout pregnancy, but they are not always a sign that you are in labor. You may have practice contractions called Braxton Hicks contractions. These false labor contractions are sometimes confused with true labor. What are [place] contractions? Braxton Hicks contractions are tightening movements that occur in the muscles of the uterus before labor. Unlike true labor contractions, these contractions do not result in opening (dilation) and thinning of the cervix. Toward the end of pregnancy (32-34 weeks), Braxton Hicks contractions can happen more often and may become stronger. These contractions are sometimes difficult to tell apart from true labor because they can be very uncomfortable. You should not feel embarrassed if you go to the hospital with false labor. Sometimes, the only way to tell if you are in true labor is for your health care provider to look for changes in the cervix. The health care provider will do a physical exam and may monitor your contractions. If you are not in true labor, the exam should show that your cervix is not dilating and your water has not broken. If there are no other health problems associated with your  pregnancy, it is completely safe for you to be sent home with false labor. You may continue to have Braxton Hicks contractions until you go into true labor. How to tell the difference between true labor and false labor True labor Contractions last 30-70 seconds. Contractions become very regular. Discomfort is usually felt in the top of the uterus, and it spreads to the lower abdomen and low back. Contractions do not go away with walking. Contractions usually become more intense and increase in frequency. The cervix dilates and gets thinner. False labor Contractions are usually shorter and not as strong as true labor contractions. Contractions are usually irregular. Contractions are often felt in the front of the lower abdomen and in the groin. Contractions may go away when you walk around or change positions while lying down. Contractions get weaker and are shorter-lasting as time goes on. The cervix usually does not dilate or become thin. Follow these instructions at home:  Take over-the-counter and prescription medicines only as told by your health care provider. Keep up with your usual exercises and follow other instructions from your health care provider. Eat and drink lightly if you think  you are going into labor. If Braxton Hicks contractions are making you uncomfortable: Change your position from lying down or resting to walking, or change from walking to resting. Sit and rest in a tub of warm water. Drink enough fluid to keep your urine pale yellow. Dehydration may cause these contractions. Do slow and deep breathing several times an hour. Keep all follow-up prenatal visits as told by your health care provider. This is important. Contact a health care provider if: You have a fever. You have continuous pain in your abdomen. Get help right away if: Your contractions become stronger, more regular, and closer together. You have fluid leaking or gushing from your vagina. You pass  blood-tinged mucus (bloody show). You have bleeding from your vagina. You have low back pain that you never had before. You feel your babys head pushing down and causing pelvic pressure. Your baby is not moving inside you as much as it used to. Summary Contractions that occur before labor are called Braxton Hicks contractions, false labor, or practice contractions. Braxton Hicks contractions are usually shorter, weaker, farther apart, and less regular than true labor contractions. True labor contractions usually become progressively stronger and regular, and they become more frequent. Manage discomfort from [facility] contractions by changing position, resting in a warm bath, drinking plenty of water, or practicing deep breathing. This information is not intended to replace advice given to you by your health care provider. Make sure you discuss any questions you have with your health care provider. Document Revised: 06/02/2017 Document Reviewed: 11/03/2016 Elsevier Patient Education  [place].

## 2023-09-06 NOTE — Progress Notes (Signed)
 LOW-RISK PREGNANCY VISIT Patient name: Beverly Leon MRN [id number]  Date of birth: March 13, 1986 Chief Complaint:   Routine Prenatal Visit  History of Present Illness:   Beverly Leon is a 38 y.o. [id number] female at [redacted]w[redacted]d with an Estimated Date of Delivery: 09/14/23 being seen today for ongoing management of a low-risk pregnancy.   Today she reports some low back pain/pressure, occ contractions. Contractions: Irregular. Vag. Bleeding: None.  Movement: Present. denies leaking of fluid.     06/22/2023    9:23 AM 03/28/2023   10:06 AM 10/19/2022    9:31 AM 03/08/2018    9:31 AM  Depression screen PHQ 2/9  Decreased Interest 1 1 0 0  Down, Depressed, Hopeless 0 0 0 0  PHQ - 2 Score 1 1 0 0  Altered sleeping 0 2 0   Tired, decreased energy 2 2 1    Change in appetite 0 0 0   Feeling bad or failure about yourself  0 0 1   Trouble concentrating 1 2 1    Moving slowly or fidgety/restless 0 0 0   Suicidal thoughts 0 0 0   PHQ-9 Score 4 7 3          03/28/2023   10:07 AM 10/19/2022    9:32 AM  GAD 7 : Generalized Anxiety Score  Nervous, Anxious, on Edge 0 0  Control/stop worrying 0 0  Worry too much - different things 0 0  Trouble relaxing 0 0  Restless 1 0  Easily annoyed or irritable 1 1  Afraid - awful might happen 0 0  Total GAD 7 Score 2 1      Review of Systems:   Pertinent items are noted in HPI Denies abnormal vaginal discharge w/ itching/odor/irritation, headaches, visual changes, shortness of breath, chest pain, abdominal pain, severe nausea/vomiting, or problems with urination or bowel movements unless otherwise stated above. Pertinent History Reviewed:  Reviewed past medical,surgical, social, obstetrical and family history.  Reviewed problem list, medications and allergies. Physical Assessment:   Vitals:   09/06/23 1136  BP: 132/84  Pulse: 94  Weight: 184 lb (83.5 kg)  Body mass index is 30.62 kg/m.        Physical Examination:   General appearance: Well  appearing, and in no distress  Mental status: Alert, oriented to person, place, and time  Skin: Warm & dry  Cardiovascular: Normal heart rate noted  Respiratory: Normal respiratory effort, no distress  Abdomen: Soft, gravid, nontender  Pelvic: Cervical exam deferred         Extremities: Edema: None  Fetal Status: Fetal Heart Rate (bpm): 146 Fundal Height: 37 cm Movement: Present    Chaperone: N/A No results found for this or any previous visit (from the past 24 hours).  Assessment & Plan:  1) Low-risk pregnancy G4P3004 at [redacted]w[redacted]d with an Estimated Date of Delivery: 09/14/23   2) SUA, EFW 57% @ 35.6w   3) Prev c/s x 3> for RCS w/ BTS 3/11   Meds: No orders of the defined types were placed in this encounter.  Labs/procedures today: none  Reviewed: Term labor symptoms and general obstetric precautions including but not limited to vaginal bleeding, contractions, leaking of fluid and fetal movement were reviewed in detail with the patient.  All questions were answered. Does have home bp cuff. Office bp cuff given: not applicable. Check bp daily, let us know if consistently >140 and/or >90, reviewed pre-e s/s, reasons to seek care.  Follow-up: Return for 3/18 for  incision check.  Future Appointments  Date Time Provider Department Center  09/11/2023  9:00 AM [facility] PAT 1 [facility] None    No orders of the defined types were placed in this encounter.  Cheral Marker CNM, [facility] 09/06/2023 12:01 PM

## 2023-09-10 NOTE — Anesthesia Preprocedure Evaluation (Signed)
 Anesthesia Evaluation  Patient identified by MRN, date of birth, ID band Patient awake    Reviewed: Allergy & Precautions, NPO status , Patient's Chart, lab work & pertinent test results  History of Anesthesia Complications Negative for: history of anesthetic complications  Airway Mallampati: II  TM Distance: >3 FB Neck ROM: Full    Dental   Pulmonary neg shortness of breath, neg sleep apnea, neg COPD, neg recent URI, Current SmokerPatient did not abstain from smoking.   Pulmonary exam normal breath sounds clear to auscultation       Cardiovascular negative cardio ROS  Rhythm:Regular Rate:Normal     Neuro/Psych negative neurological ROS     GI/Hepatic negative GI ROS,,,(+)     substance abuse (h/o heroin use)    Endo/Other  negative endocrine ROS    Renal/GU negative Renal ROS     Musculoskeletal   Abdominal   Peds  Hematology  (+) Blood dyscrasia, anemia Lab Results      Component                Value               Date                      WBC                      10.8 (H)            09/11/2023                HGB                      11.4 (L)            09/11/2023                HCT                      33.4 (L)            09/11/2023                MCV                      96.8                09/11/2023                PLT                      227                 09/11/2023              Anesthesia Other Findings Previous c-section x3  Reproductive/Obstetrics (+) Pregnancy                             Anesthesia Physical Anesthesia Plan  ASA: 2  Anesthesia Plan: Spinal   Post-op Pain Management:    Induction:   PONV Risk Score and Plan: 1 and Ondansetron, Dexamethasone and Treatment may vary due to age or medical condition  Airway Management Planned: Natural Airway  Additional Equipment:   Intra-op Plan:   Post-operative Plan:   Informed Consent: I have reviewed the  patients History and Physical, chart, labs and discussed the procedure including the risks,  benefits and alternatives for the proposed anesthesia with the patient or authorized representative who has indicated his/her understanding and acceptance.       Plan Discussed with: CRNA and Anesthesiologist  Anesthesia Plan Comments: (I have discussed risks of neuraxial anesthesia including but not limited to infection, bleeding, nerve injury, back pain, headache, seizures, and failure of block. Patient denies bleeding disorders and is not currently anticoagulated. Labs have been reviewed. Risks and benefits discussed. All patient's questions answered.  )        Anesthesia Quick Evaluation

## 2023-09-11 ENCOUNTER — Encounter (HOSPITAL_COMMUNITY)
Admission: RE | Admit: 2023-09-11 | Discharge: 2023-09-11 | Disposition: A | Discharge status: Home / Self Care | Payer: Medicaid Other | Source: Ambulatory Visit | Attending: Obstetrics & Gynecology | Admitting: Obstetrics & Gynecology

## 2023-09-11 ENCOUNTER — Encounter (HOSPITAL_COMMUNITY): Payer: Self-pay | Admitting: Obstetrics & Gynecology

## 2023-09-11 ENCOUNTER — Other Ambulatory Visit: Payer: Self-pay | Admitting: Obstetrics and Gynecology

## 2023-09-11 DIAGNOSIS — Z3483 Encounter for supervision of other normal pregnancy, third trimester: Secondary | ICD-10-CM

## 2023-09-11 DIAGNOSIS — Z01812 Encounter for preprocedural laboratory examination: Secondary | ICD-10-CM | POA: Insufficient documentation

## 2023-09-11 LAB — CBC
HCT: 33.4 % — ABNORMAL LOW (ref 36.0–46.0)
Hemoglobin: 11.4 g/dL — ABNORMAL LOW (ref 12.0–15.0)
MCH: 33 pg (ref 26.0–34.0)
MCHC: 34.1 g/dL (ref 30.0–36.0)
MCV: 96.8 fL (ref 80.0–100.0)
Platelets: 227 10*3/uL (ref 150–400)
RBC: 3.45 MIL/uL — ABNORMAL LOW (ref 3.87–5.11)
RDW: 13 % (ref 11.5–15.5)
WBC: 10.8 10*3/uL — ABNORMAL HIGH (ref 4.0–10.5)
nRBC: 0 % (ref 0.0–0.2)

## 2023-09-11 LAB — TYPE AND SCREEN
ABO/RH(D): A NEG
Antibody Screen: POSITIVE

## 2023-09-11 LAB — RPR: RPR Ser Ql: NONREACTIVE

## 2023-09-12 ENCOUNTER — Inpatient Hospital Stay (HOSPITAL_COMMUNITY): Payer: Self-pay | Admitting: Anesthesiology

## 2023-09-12 ENCOUNTER — Encounter (HOSPITAL_COMMUNITY): Payer: Self-pay | Admitting: Obstetrics & Gynecology

## 2023-09-12 ENCOUNTER — Encounter (HOSPITAL_COMMUNITY)
Admission: AD | Disposition: A | Discharge status: Home / Self Care | Payer: Self-pay | Source: Home / Self Care | Attending: Obstetrics & Gynecology

## 2023-09-12 ENCOUNTER — Inpatient Hospital Stay (HOSPITAL_COMMUNITY)
Admission: AD | Admit: 2023-09-12 | Discharge: 2023-09-14 | DRG: 785 | Disposition: A | Discharge status: Home / Self Care | Payer: Medicaid Other | Attending: Obstetrics & Gynecology | Admitting: Obstetrics & Gynecology

## 2023-09-12 ENCOUNTER — Other Ambulatory Visit: Payer: Self-pay

## 2023-09-12 DIAGNOSIS — Z8249 Family history of ischemic heart disease and other diseases of the circulatory system: Secondary | ICD-10-CM | POA: Diagnosis not present

## 2023-09-12 DIAGNOSIS — Z98891 History of uterine scar from previous surgery: Principal | ICD-10-CM

## 2023-09-12 DIAGNOSIS — O9902 Anemia complicating childbirth: Secondary | ICD-10-CM | POA: Diagnosis present

## 2023-09-12 DIAGNOSIS — O34211 Maternal care for low transverse scar from previous cesarean delivery: Principal | ICD-10-CM | POA: Diagnosis present

## 2023-09-12 DIAGNOSIS — Z3490 Encounter for supervision of normal pregnancy, unspecified, unspecified trimester: Secondary | ICD-10-CM

## 2023-09-12 DIAGNOSIS — Z6791 Unspecified blood type, Rh negative: Secondary | ICD-10-CM | POA: Diagnosis not present

## 2023-09-12 DIAGNOSIS — Z3A39 39 weeks gestation of pregnancy: Secondary | ICD-10-CM

## 2023-09-12 DIAGNOSIS — F17210 Nicotine dependence, cigarettes, uncomplicated: Secondary | ICD-10-CM | POA: Diagnosis present

## 2023-09-12 DIAGNOSIS — O26899 Other specified pregnancy related conditions, unspecified trimester: Secondary | ICD-10-CM

## 2023-09-12 DIAGNOSIS — O99334 Smoking (tobacco) complicating childbirth: Secondary | ICD-10-CM | POA: Diagnosis present

## 2023-09-12 DIAGNOSIS — O0932 Supervision of pregnancy with insufficient antenatal care, second trimester: Secondary | ICD-10-CM

## 2023-09-12 DIAGNOSIS — Z302 Encounter for sterilization: Secondary | ICD-10-CM

## 2023-09-12 DIAGNOSIS — F1191 Opioid use, unspecified, in remission: Secondary | ICD-10-CM

## 2023-09-12 DIAGNOSIS — F17200 Nicotine dependence, unspecified, uncomplicated: Secondary | ICD-10-CM | POA: Diagnosis present

## 2023-09-12 DIAGNOSIS — O09523 Supervision of elderly multigravida, third trimester: Secondary | ICD-10-CM | POA: Diagnosis not present

## 2023-09-12 DIAGNOSIS — O09529 Supervision of elderly multigravida, unspecified trimester: Secondary | ICD-10-CM

## 2023-09-12 DIAGNOSIS — O26893 Other specified pregnancy related conditions, third trimester: Secondary | ICD-10-CM | POA: Diagnosis present

## 2023-09-12 SURGERY — Surgical Case
Anesthesia: Spinal | Laterality: Bilateral

## 2023-09-12 MED ORDER — SODIUM CHLORIDE 0.9 % IR SOLN
Status: DC | PRN
  Administered 2023-09-12: 1

## 2023-09-12 MED ORDER — TRANEXAMIC ACID-NACL 1000-0.7 MG/100ML-% IV SOLN
INTRAVENOUS | Status: AC
  Filled 2023-09-12: qty 100

## 2023-09-12 MED ORDER — EPHEDRINE SULFATE-NACL 50-0.9 MG/10ML-% IV SOSY
PREFILLED_SYRINGE | INTRAVENOUS | Status: DC | PRN
  Administered 2023-09-12 (×3): 5 mg via INTRAVENOUS

## 2023-09-12 MED ORDER — GABAPENTIN 300 MG PO CAPS
300.0000 mg | ORAL_CAPSULE | Freq: Three times a day (TID) | ORAL | Status: DC
  Administered 2023-09-12 – 2023-09-14 (×6): 300 mg via ORAL
  Filled 2023-09-12 (×2): qty 1
  Filled 2023-09-12: qty 3
  Filled 2023-09-12 (×4): qty 1
  Filled 2023-09-12 (×2): qty 3
  Filled 2023-09-12: qty 1
  Filled 2023-09-12: qty 3

## 2023-09-12 MED ORDER — FENTANYL CITRATE (PF) 100 MCG/2ML IJ SOLN
INTRAMUSCULAR | Status: DC | PRN
  Administered 2023-09-12: 15 ug via INTRATHECAL

## 2023-09-12 MED ORDER — PHENYLEPHRINE HCL-NACL 20-0.9 MG/250ML-% IV SOLN
INTRAVENOUS | Status: DC | PRN
  Administered 2023-09-12: 60 ug/min via INTRAVENOUS

## 2023-09-12 MED ORDER — DIPHENHYDRAMINE HCL 25 MG PO CAPS: 25.0000 mg | ORAL_CAPSULE | Freq: Four times a day (QID) | ORAL | Status: DC | PRN

## 2023-09-12 MED ORDER — KETOROLAC TROMETHAMINE 30 MG/ML IJ SOLN
30.0000 mg | Freq: Once | INTRAMUSCULAR | Status: AC | PRN
  Administered 2023-09-12: 30 mg via INTRAVENOUS

## 2023-09-12 MED ORDER — KETOROLAC TROMETHAMINE 30 MG/ML IJ SOLN
INTRAMUSCULAR | Status: AC
  Filled 2023-09-12: qty 1

## 2023-09-12 MED ORDER — LACTATED RINGERS IV SOLN
INTRAVENOUS | Status: AC
Start: 2023-09-12 — End: 2023-09-13

## 2023-09-12 MED ORDER — CEFAZOLIN SODIUM-DEXTROSE 2-4 GM/100ML-% IV SOLN
INTRAVENOUS | Status: AC
  Filled 2023-09-12: qty 100

## 2023-09-12 MED ORDER — GLYCOPYRROLATE 0.2 MG/ML IJ SOLN
INTRAMUSCULAR | Status: DC | PRN
  Administered 2023-09-12: .2 mg via INTRAVENOUS

## 2023-09-12 MED ORDER — MEASLES, MUMPS & RUBELLA VAC IJ SOLR: 0.5000 mL | Freq: Once | INTRAMUSCULAR | Status: DC

## 2023-09-12 MED ORDER — WITCH HAZEL-GLYCERIN EX PADS: 1.0000 | MEDICATED_PAD | CUTANEOUS | Status: DC | PRN

## 2023-09-12 MED ORDER — COCONUT OIL OIL: 1.0000 | TOPICAL_OIL | Status: DC | PRN

## 2023-09-12 MED ORDER — DEXAMETHASONE SODIUM PHOSPHATE 10 MG/ML IJ SOLN
INTRAMUSCULAR | Status: DC | PRN
Start: 2023-09-12 — End: 2023-09-12
  Administered 2023-09-12: 10 mg via INTRAVENOUS

## 2023-09-12 MED ORDER — SENNOSIDES-DOCUSATE SODIUM 8.6-50 MG PO TABS
2.0000 | ORAL_TABLET | Freq: Every day | ORAL | Status: DC
  Administered 2023-09-13 – 2023-09-14 (×2): 2 via ORAL
  Filled 2023-09-12 (×2): qty 2

## 2023-09-12 MED ORDER — DEXAMETHASONE SODIUM PHOSPHATE 10 MG/ML IJ SOLN
INTRAMUSCULAR | Status: AC
  Filled 2023-09-12: qty 1

## 2023-09-12 MED ORDER — MEDROXYPROGESTERONE ACETATE 150 MG/ML IM SUSP: 150.0000 mg | INTRAMUSCULAR | Status: DC | PRN

## 2023-09-12 MED ORDER — BUPIVACAINE IN DEXTROSE 0.75-8.25 % IT SOLN
INTRATHECAL | Status: DC | PRN
  Administered 2023-09-12: 1.6 mL via INTRATHECAL

## 2023-09-12 MED ORDER — SIMETHICONE 80 MG PO CHEW: 80.0000 mg | CHEWABLE_TABLET | ORAL | Status: DC | PRN

## 2023-09-12 MED ORDER — FENTANYL CITRATE (PF) 100 MCG/2ML IJ SOLN
INTRAMUSCULAR | Status: AC
  Filled 2023-09-12: qty 2

## 2023-09-12 MED ORDER — OXYTOCIN-SODIUM CHLORIDE 30-0.9 UT/500ML-% IV SOLN
INTRAVENOUS | Status: DC | PRN
  Administered 2023-09-12: 300 mL via INTRAVENOUS

## 2023-09-12 MED ORDER — STERILE WATER FOR IRRIGATION IR SOLN
Status: DC | PRN
  Administered 2023-09-12: 1000 mL

## 2023-09-12 MED ORDER — MORPHINE SULFATE (PF) 0.5 MG/ML IJ SOLN
INTRAMUSCULAR | Status: DC | PRN
  Administered 2023-09-12: 150 ug via INTRATHECAL

## 2023-09-12 MED ORDER — ACETAMINOPHEN 10 MG/ML IV SOLN: 1000.0000 mg | Freq: Once | INTRAVENOUS | Status: DC | PRN

## 2023-09-12 MED ORDER — CEFAZOLIN SODIUM-DEXTROSE 2-4 GM/100ML-% IV SOLN
2.0000 g | INTRAVENOUS | Status: AC
  Administered 2023-09-12: 2 g via INTRAVENOUS

## 2023-09-12 MED ORDER — MENTHOL 3 MG MT LOZG: 1.0000 | LOZENGE | OROMUCOSAL | Status: DC | PRN

## 2023-09-12 MED ORDER — ONDANSETRON HCL 4 MG/2ML IJ SOLN
INTRAMUSCULAR | Status: DC | PRN
  Administered 2023-09-12: 4 mg via INTRAVENOUS

## 2023-09-12 MED ORDER — RHO D IMMUNE GLOBULIN 1500 UNIT/2ML IJ SOSY
300.0000 ug | PREFILLED_SYRINGE | Freq: Once | INTRAMUSCULAR | Status: AC
  Administered 2023-09-13: 300 ug via INTRAVENOUS
  Filled 2023-09-12: qty 2

## 2023-09-12 MED ORDER — MORPHINE SULFATE (PF) 0.5 MG/ML IJ SOLN
INTRAMUSCULAR | Status: AC
  Filled 2023-09-12: qty 10

## 2023-09-12 MED ORDER — TRANEXAMIC ACID-NACL 1000-0.7 MG/100ML-% IV SOLN
1000.0000 mg | Freq: Once | INTRAVENOUS | Status: AC
  Administered 2023-09-12: 1000 mg via INTRAVENOUS

## 2023-09-12 MED ORDER — ONDANSETRON HCL 4 MG/2ML IJ SOLN
INTRAMUSCULAR | Status: AC
  Filled 2023-09-12: qty 2

## 2023-09-12 MED ORDER — FENTANYL CITRATE (PF) 100 MCG/2ML IJ SOLN
INTRAMUSCULAR | Status: DC | PRN
  Administered 2023-09-12: 25 ug via INTRAVENOUS
  Administered 2023-09-12: 35 ug via INTRAVENOUS

## 2023-09-12 MED ORDER — ACETAMINOPHEN 500 MG PO TABS
1000.0000 mg | ORAL_TABLET | Freq: Three times a day (TID) | ORAL | Status: DC
  Administered 2023-09-12 – 2023-09-14 (×6): 1000 mg via ORAL
  Filled 2023-09-12 (×6): qty 2

## 2023-09-12 MED ORDER — EPHEDRINE 5 MG/ML INJ
INTRAVENOUS | Status: AC
  Filled 2023-09-12: qty 5

## 2023-09-12 MED ORDER — IBUPROFEN 800 MG PO TABS
800.0000 mg | ORAL_TABLET | Freq: Three times a day (TID) | ORAL | Status: DC
  Administered 2023-09-13 – 2023-09-14 (×3): 800 mg via ORAL
  Filled 2023-09-12 (×3): qty 1

## 2023-09-12 MED ORDER — KETOROLAC TROMETHAMINE 30 MG/ML IJ SOLN
30.0000 mg | Freq: Four times a day (QID) | INTRAMUSCULAR | Status: AC
  Administered 2023-09-12 – 2023-09-13 (×4): 30 mg via INTRAVENOUS
  Filled 2023-09-12 (×4): qty 1

## 2023-09-12 MED ORDER — OXYCODONE HCL 5 MG PO TABS: 5.0000 mg | ORAL_TABLET | ORAL | Status: DC | PRN

## 2023-09-12 MED ORDER — GLYCOPYRROLATE PF 0.2 MG/ML IJ SOSY
PREFILLED_SYRINGE | INTRAMUSCULAR | Status: AC
  Filled 2023-09-12: qty 1

## 2023-09-12 MED ORDER — PROMETHAZINE (PHENERGAN) 6.25MG IN NS 50ML IVPB: 6.2500 mg | INTRAVENOUS | Status: DC | PRN

## 2023-09-12 MED ORDER — OXYTOCIN-SODIUM CHLORIDE 30-0.9 UT/500ML-% IV SOLN
2.5000 [IU]/h | INTRAVENOUS | Status: AC
  Administered 2023-09-12: 2.5 [IU]/h via INTRAVENOUS
  Filled 2023-09-12: qty 500

## 2023-09-12 MED ORDER — FENTANYL CITRATE (PF) 100 MCG/2ML IJ SOLN: 25.0000 ug | INTRAMUSCULAR | Status: DC | PRN

## 2023-09-12 MED ORDER — OXYCODONE HCL 5 MG PO TABS: 5.0000 mg | ORAL_TABLET | Freq: Once | ORAL | Status: DC | PRN

## 2023-09-12 MED ORDER — ZOLPIDEM TARTRATE 5 MG PO TABS: 5.0000 mg | ORAL_TABLET | Freq: Every evening | ORAL | Status: DC | PRN

## 2023-09-12 MED ORDER — PRENATAL MULTIVITAMIN CH
1.0000 | ORAL_TABLET | Freq: Every day | ORAL | Status: DC
  Administered 2023-09-13: 1 via ORAL
  Filled 2023-09-12: qty 1

## 2023-09-12 MED ORDER — POVIDONE-IODINE 10 % EX SWAB: 2.0000 | Freq: Once | CUTANEOUS | Status: DC

## 2023-09-12 MED ORDER — SIMETHICONE 80 MG PO CHEW
80.0000 mg | CHEWABLE_TABLET | Freq: Three times a day (TID) | ORAL | Status: DC
  Administered 2023-09-12 – 2023-09-14 (×5): 80 mg via ORAL
  Filled 2023-09-12 (×5): qty 1

## 2023-09-12 MED ORDER — ENOXAPARIN SODIUM 40 MG/0.4ML IJ SOSY
40.0000 mg | PREFILLED_SYRINGE | INTRAMUSCULAR | Status: DC
  Administered 2023-09-12 – 2023-09-13 (×2): 40 mg via SUBCUTANEOUS
  Filled 2023-09-12 (×2): qty 0.4

## 2023-09-12 MED ORDER — OXYCODONE HCL 5 MG/5ML PO SOLN: 5.0000 mg | Freq: Once | ORAL | Status: DC | PRN

## 2023-09-12 MED ORDER — DIBUCAINE (PERIANAL) 1 % EX OINT: 1.0000 | TOPICAL_OINTMENT | CUTANEOUS | Status: DC | PRN

## 2023-09-12 SURGICAL SUPPLY — 30 items
CHLORAPREP W/TINT 26 (MISCELLANEOUS) ×1 IMPLANT
CLAMP UMBILICAL CORD (MISCELLANEOUS) ×1 IMPLANT
CLOTH BEACON ORANGE TIMEOUT ST (SAFETY) ×1 IMPLANT
DERMABOND ADVANCED .7 DNX12 (GAUZE/BANDAGES/DRESSINGS) ×2 IMPLANT
DRSG OPSITE POSTOP 4X10 (GAUZE/BANDAGES/DRESSINGS) ×1 IMPLANT
ELECT REM PT RETURN 9FT ADLT (ELECTROSURGICAL) ×1 IMPLANT
ELECTRODE REM PT RTRN 9FT ADLT (ELECTROSURGICAL) ×1 IMPLANT
EXTRACTOR VACUUM BELL STYLE (SUCTIONS) IMPLANT
GLOVE BIOGEL PI IND STRL 7.0 (GLOVE) ×1 IMPLANT
GLOVE BIOGEL PI IND STRL 8 (GLOVE) ×1 IMPLANT
GLOVE ECLIPSE 8.0 STRL XLNG CF (GLOVE) ×1 IMPLANT
GOWN STRL REUS W/TWL LRG LVL3 (GOWN DISPOSABLE) ×2 IMPLANT
KIT ABG SYR 3ML LUER SLIP (SYRINGE) ×1 IMPLANT
LIGASURE IMPACT 36 18CM CVD LR (INSTRUMENTS) IMPLANT
NDL HYPO 25X5/8 SAFETYGLIDE (NEEDLE) ×1 IMPLANT
NEEDLE HYPO 25X5/8 SAFETYGLIDE (NEEDLE) ×1 IMPLANT
NS IRRIG 1000ML POUR BTL (IV SOLUTION) ×1 IMPLANT
PACK C SECTION WH (CUSTOM PROCEDURE TRAY) ×1 IMPLANT
PAD OB MATERNITY 4.3X12.25 (PERSONAL CARE ITEMS) ×1 IMPLANT
RTRCTR C-SECT PINK 25CM LRG (MISCELLANEOUS) IMPLANT
SUT CHROMIC 0 CT 1 (SUTURE) ×1 IMPLANT
SUT MNCRL 0 VIOLET CTX 36 (SUTURE) ×2 IMPLANT
SUT PLAIN 2 0 XLH (SUTURE) IMPLANT
SUT PLAIN ABS 2-0 CT1 27XMFL (SUTURE) IMPLANT
SUT VIC AB 0 CTX36XBRD ANBCTRL (SUTURE) ×1 IMPLANT
SUT VIC AB 4-0 KS 27 (SUTURE) IMPLANT
SYR 20CC LL (SYRINGE) ×2 IMPLANT
TOWEL OR 17X24 6PK STRL BLUE (TOWEL DISPOSABLE) ×1 IMPLANT
TRAY FOLEY W/BAG SLVR 14FR LF (SET/KITS/TRAYS/PACK) IMPLANT
WATER STERILE IRR 1000ML POUR (IV SOLUTION) ×1 IMPLANT

## 2023-09-12 NOTE — H&P (Signed)
 Preoperative History and Physical  Beverly Leon is a 38 y.o. [id number] [redacted]w[redacted]d Estimated Date of Delivery: 09/14/23 previous C section x 3 with No LMP recorded. Patient is pregnant. admitted for a repeat Caesarean section + tubal ligation.    PMH:    Past Medical History:  Diagnosis Date   Anemia     PSH:     Past Surgical History:  Procedure Laterality Date   CESAREAN SECTION     x 3   TONSILLECTOMY      POb/GynH:      OB History     Gravida  4   Para  3   Term  3   Preterm      AB      Living  4      SAB      IAB      Ectopic      Multiple  1   Live Births  4           SH:   Social History   Tobacco Use   Smoking status: Every Day    Current packs/day: 1.00    Average packs/day: 1 pack/day for 15.0 years (15.0 ttl pk-yrs)    Types: Cigarettes   Smokeless tobacco: Never  Vaping Use   Vaping status: Never Used  Substance Use Topics   Alcohol use: Not Currently   Drug use: Not Currently    Types: IV    Comment: heroin    FH:    Family History  Problem Relation Age of Onset   Hypertension Mother    Hypertension Father    High Cholesterol Father    Depression Brother    Depression Daughter    Heart attack Maternal Grandfather    Cancer Paternal Grandfather      Allergies: No Known Allergies  Medications:       Current Facility-Administered Medications:    ceFAZolin (ANCEF) IVPB 2g/100 mL premix, 2 g, Intravenous, On Call to OR, Wouk, Wilfred Curtis, MD   lactated ringers infusion, , Intravenous, Continuous, Pickens, [place], MD, Last Rate: 40 mL/hr at 09/12/23 0720, Continued from Pre-op at 09/12/23 0720   povidone-iodine 10 % swab 2 Application, 2 Application, Topical, Once, Wouk, Wilfred Curtis, MD   tranexamic acid (CYKLOKAPRON) IVPB 1,000 mg, 1,000 mg, Intravenous, Once, Wouk, Wilfred Curtis, MD  Review of Systems:   Review of Systems  Constitutional: Negative for fever, chills, weight loss, malaise/fatigue and diaphoresis.   HENT: Negative for hearing loss, ear pain, nosebleeds, congestion, sore throat, neck pain, tinnitus and ear discharge.   Eyes: Negative for blurred vision, double vision, photophobia, pain, discharge and redness.  Respiratory: Negative for cough, hemoptysis, sputum production, shortness of breath, wheezing and stridor.   Cardiovascular: Negative for chest pain, palpitations, orthopnea, claudication, leg swelling and PND.  Gastrointestinal: Positive for abdominal pain. Negative for heartburn, nausea, vomiting, diarrhea, constipation, blood in stool and melena.  Genitourinary: Negative for dysuria, urgency, frequency, hematuria and flank pain.  Musculoskeletal: Negative for myalgias, back pain, joint pain and falls.  Skin: Negative for itching and rash.  Neurological: Negative for dizziness, tingling, tremors, sensory change, speech change, focal weakness, seizures, loss of consciousness, weakness and headaches.  Endo/Heme/Allergies: Negative for environmental allergies and polydipsia. Does not bruise/bleed easily.  Psychiatric/Behavioral: Negative for depression, suicidal ideas, hallucinations, memory loss and substance abuse. The patient is not nervous/anxious and does not have insomnia.      PHYSICAL EXAM:  There were no vitals taken for this  visit.    Vitals reviewed. Constitutional: She is oriented to person, place, and time. She appears well-developed and well-nourished.  HENT:  Head: Normocephalic and atraumatic.  Right Ear: External ear normal.  Left Ear: External ear normal.  Nose: Nose normal.  Mouth/Throat: Oropharynx is clear and moist.  Eyes: Conjunctivae and EOM are normal. Pupils are equal, round, and reactive to light. Right eye exhibits no discharge. Left eye exhibits no discharge. No scleral icterus.  Neck: Normal range of motion. Neck supple. No tracheal deviation present. No thyromegaly present.  Cardiovascular: Normal rate, regular rhythm, normal heart sounds and  intact distal pulses.  Exam reveals no gallop and no friction rub.   No murmur heard. Respiratory: Effort normal and breath sounds normal. No respiratory distress. She has no wheezes. She has no rales. She exhibits no tenderness.  GI: Soft. Bowel sounds are normal. She exhibits no distension and no mass. There is tenderness. There is no rebound and no guarding.  Genitourinary:       Vulva is normal without lesions Vagina is pink moist without discharge Cervix normal in appearance and pap is normal Uterus is term size Adnexa is negative with normal sized ovaries by sonogram  Musculoskeletal: Normal range of motion. She exhibits no edema and no tenderness.  Neurological: She is alert and oriented to person, place, and time. She has normal reflexes. She displays normal reflexes. No cranial nerve deficit. She exhibits normal muscle tone. Coordination normal.  Skin: Skin is warm and dry. No rash noted. No erythema. No pallor.  Psychiatric: She has a normal mood and affect. Her behavior is normal. Judgment and thought content normal.    Labs: Results for orders placed or performed during the hospital encounter of 09/11/23 (from the past 2 weeks)  Type and screen   Collection Time: 09/11/23  9:26 AM  Result Value Ref Range   ABO/RH(D) A NEG    Antibody Screen POS    Sample Expiration 09/14/2023,2359    Antibody Identification      PASSIVELY ACQUIRED ANTI-D Performed at [facility], [address], [place], [place] [address]   CBC   Collection Time: 09/11/23  9:30 AM  Result Value Ref Range   WBC 10.8 (H) 4.0 - 10.5 K/uL   RBC 3.45 (L) 3.87 - 5.11 MIL/uL   Hemoglobin 11.4 (L) 12.0 - 15.0 g/dL   HCT 42.5 (L) 95.6 - 38.7 %   MCV 96.8 80.0 - 100.0 fL   MCH 33.0 26.0 - 34.0 pg   MCHC 34.1 30.0 - 36.0 g/dL   RDW 56.4 33.2 - 95.1 %   Platelets 227 150 - 400 K/uL   nRBC 0.0 0.0 - 0.2 %  RPR   Collection Time: 09/11/23  9:30 AM  Result Value Ref Range   RPR Ser Ql NON REACTIVE NON  REACTIVE    EKG: No orders found for this or any previous visit.  Imaging Studies: US OB Follow Up Result Date: 08/16/2023 Table formatting from the original result was not included. Images from the original result were not included.  ..an [employer] of [facility]trasound Medicine Technical sales engineer) accredited practice Center for [facility] @ [facility] [address] [place] [address] Ordering Provider: Arabella Merles, CNM FOLLOW UP SONOGRAM KEIRAN SIAS is in the office for a follow up sonogram for EFW. She is a 38 y.o. year old [id number] with Estimated Date of Delivery: 09/14/23 by early ultrasound now at  [redacted]w[redacted]d weeks gestation. Thus  far the pregnancy has been complicated by AMA,2VC,smoker . GESTATION: SINGLETON PRESENTATION: cephalic FETAL ACTIVITY:          Heart rate         132          The fetus is active. AMNIOTIC FLUID: The amniotic fluid volume is  normal, 19 cm. PLACENTA LOCALIZATION:  anterior GRADE 0 CERVIX: Limited view GESTATIONAL AGE AND  BIOMETRICS: Gestational criteria: Estimated Date of Delivery: 09/14/23 by early ultrasound now at [redacted]w[redacted]d Previous Scans:4          BIPARIETAL DIAMETER           9.20 cm         37+3 weeks HEAD CIRCUMFERENCE           33.40 cm         38+1 weeks ABDOMINAL CIRCUMFERENCE           31.57 cm         35+3 weeks FEMUR LENGTH           7.00 cm         35+6 weeks                                                       AVERAGE EGA(BY THIS SCAN):  36+5 weeks                                                 ESTIMATED FETAL WEIGHT:       2845  grams, 57 % ANATOMICAL SURVEY                                                                            COMMENTS CEREBRAL VENTRICLES yes normal  CHOROID PLEXUS yes normal  CEREBELLUM yes normal  CISTERNA MAGNA  Yes  normal   CAVUM SEPTI PELLUCIDI YES NORMAL          NASAL BONE yes normal  NOSE/LIP yes normal  FACIAL PROFILE yes normal  4 CHAMBERED HEART yes normal  OUTFLOW TRACTS YES normaL  3VV YES NORMAL  3VTV YES NORMAL   SITUS YES NORMAL      DIAPHRAGM yes normal  STOMACH yes normal  RENAL REGION yes normal  BLADDER yes normal          3 VESSEL CORD yes  SINGLE UMBILICAL ARTERY              GENITALIA   female     SUSPECTED ABNORMALITIES:  2VC QUALITY OF SCAN: satisfactory TECHNICIAN COMMENTS: [name] 35+6 wks,cephalic,anterior placenta gr 0,AFI 19 cm,FHR 132 bpm,single umbilical artery,EFW 2845 g 57% A copy of this report including all images has been saved and backed up to a second source for retrieval if needed. All measures and details of the anatomical scan, placentation, fluid volume and pelvic anatomy are contained in that report. Karie Chimera 08/16/2023 11:36 AM Clinical Impression  and recommendations: I have reviewed the sonogram results above, combined with the patient's current clinical course, below are my impressions and any appropriate recommendations for management based on the sonographic findings. 1.  [id number] Estimated Date of Delivery: 09/14/23 by serial sonographic evaluations 2.  Fetal sonographic surveillance findings: a). Normal fluid volume b). Normal growth percentile with appropriate interval growth:  57% Single umbilical artery previously noted 3.  Normal general sonographic findings Recommend continued prenatal evaluations and care based on this sonogram and as clinically indicated from the patient's clinical course. Myna Hidalgo, DO Attending Obstetrician & Gynecologist, [facility] for [facility], [facility]       Assessment: [id number] Estimated Date of Delivery: 09/14/23  [redacted]w[redacted]d  Desires permanent sterilization Previous C section x 3  Patient Active Problem List   Diagnosis Date Noted   AMA (advanced maternal age) multigravida 35+ 08/02/2023   Rh negative state in antepartum period 03/29/2023   Encounter for supervision of normal pregnancy, antepartum 03/28/2023   History of cesarean delivery 03/28/2023   Late prenatal care in second trimester 03/28/2023   History  of heroin use 03/28/2023   Smoker 03/28/2023    Plan: Repeat Caesarean section + bilateral tubal ligation  Lazaro Arms 09/12/2023 7:57 AM

## 2023-09-12 NOTE — Lactation Note (Signed)
 This note was copied from a baby's chart. Lactation Consultation Note  Patient Name: Beverly Leon [id number] Date: 09/12/2023 Age:38 hours Reason for consult: Initial assessment;1st time breastfeeding;Term  P4- MOB has 5 children now, but states that this is her first time breastfeeding. MOB plans to offer both formula and breast milk, but she would like to see how the first 24 hrs ago before offering formula. LC reviewed how we look at infant's weight loss and pees/stools to tell us how well infant is doing on the breast. LC encouraged her to continue offering the breast for now, and if she needs to offer formula second. MOB reports infant nursed well at first, but he is now too sleepy to latch. MOB has hand expressed and offered EBM for the last feeding. LC reviewed how this is normal behavior in the first 24 hrs. LC explained the first 24 hr birthday nap and the day 2 cluster feeding. LC encouraged MOB to continue offering the breast. LC also encouraged MOB to call for latch assistance for the next feeding if needed.  LC reviewed feeding infant on cue 8-12x in 24 hrs, not allowing infant to go over 3 hrs without a feeding, CDC milk storage guidelines, LC services handout, position options and engorgement/breast care. LC encouraged MOB to call for further assistance as needed.  Maternal Data Has patient been taught Hand Expression?: Yes Does the patient have breastfeeding experience prior to this delivery?: No  Feeding Mother's Current Feeding Choice: Breast Milk and Formula  Lactation Tools Discussed/Used Pump Education: Milk Storage  Interventions Interventions: Breast feeding basics reviewed;Education  Discharge Discharge Education: Engorgement and breast care;Warning signs for feeding baby Pump: DEBP;Personal;Advised to call insurance company (Has a pump, but wants another. Encouraged to call insurance.)  Consult Status Consult Status: Follow-up Date: 09/13/23 Follow-up type:  In-patient    Dema Severin BS, IBCLC 09/12/2023, 5:32 PM

## 2023-09-12 NOTE — Transfer of Care (Signed)
 Immediate Anesthesia Transfer of Care Note  Patient: Beverly Leon  Procedure(s) Performed: CESAREAN SECTION, WITH BILATERAL TUBAL LIGATION (Bilateral)  Patient Location: PACU  Anesthesia Type:Spinal  Level of Consciousness: awake  Airway & Oxygen Therapy: Patient Spontanous Breathing  Post-op Assessment: Report given to RN  Post vital signs: Reviewed and stable  Last Vitals:  Vitals Value Taken Time  BP 108/59 09/12/23 0922  Temp    Pulse 76 09/12/23 0923  Resp 18 09/12/23 0923  SpO2 98 % 09/12/23 0923  Vitals shown include unfiled device data.  Last Pain: There were no vitals filed for this visit.       Complications: No notable events documented.

## 2023-09-12 NOTE — Op Note (Signed)
 Cesarean Section Operative Note   Patient: Beverly Leon  Date of Procedure: 09/12/2023  Procedure: Repeat Low Transverse Cesarean and Bilateral Tubal Ligation via Bilateral salpingectomy   Indications: history of cesarean x3, desired sterilization  Pre-operative Diagnosis: PREVIOUS CSECTION X3  DESIRED STERILIZATION.   Post-operative Diagnosis: Same and Bilateral Tubal Sterilization via Bilateral salpingectomy  TOLAC Candidate: No  Surgeon: Surgeons and Role:    * Lazaro Arms, MD - Primary    * Joanne Gavel, MD - Fellow  Assistants: An experienced assistant was required given the standard of surgical care given the complexity of the case.  This assistant was needed for exposure, dissection, suctioning, retraction, instrument exchange, assisting with delivery with administration of fundal pressure, and for overall help during the procedure.   Anesthesia: spinal  Anesthesiologist: Linton Rump, MD   Antibiotics: Cefazolin   Estimated Blood Loss: 208 ml   Total IV Fluids: 1000 ml  Urine Output:  100 cc OF clear urine  Specimens: bilateral fallopian tubes to pathology  Complications: no complications   Indications: Beverly Leon is a 38 y.o. [phone] with an IUP [redacted]w[redacted]d presenting for scheduled cesarean secondary to the indications listed above. Clinical course notable for none.  The risks of cesarean section were discussed with the patient including but were not limited to: bleeding which may require transfusion or reoperation; infection which may require antibiotics; injury to bowel, bladder, ureters or other surrounding organs; injury to the fetus; need for additional procedures including hysterectomy in the event of a life-threatening hemorrhage; placental abnormalities wth subsequent pregnancies, incisional problems, thromboembolic phenomenon and other postoperative/anesthesia complications.  Patient also desires permanent sterilization.  Other  reversible forms of contraception were discussed with patient; she declines all other modalities. Risks of procedure discussed with patient including but not limited to: risk of regret, permanence of method, bleeding, infection, injury to surrounding organs and need for additional procedures.  Failure risk of about 1% with increased risk of ectopic gestation if pregnancy occurs was also discussed with patient.  Also discussed possibility of post-tubal pain syndrome. The patient concurred with the proposed plan, giving informed written consent for the procedures.  Patient has been NPO since last night she will remain NPO for procedure. Anesthesia and OR aware.  Preoperative prophylactic antibiotics and SCDs ordered on call to the OR.   Findings: Viable infant in cephalic presentation, no nuchal cord present. Apgars 9, 9, . Weight 3460 g. Copious Clear amniotic fluid. Normal placenta, three vessel cord. Uterine window across lower uterine segment, Normal bilateral fallopian tubes, Normal bilateral ovaries. Moderate adhesive disease, present .  Procedure Details: A Time Out was held and the above information confirmed. The patient received intravenous antibiotics and had sequential compression devices applied to her lower extremities preoperatively. The patient was taken back to the operative suite where spinal anesthesia was administered. After induction of anesthesia, the patient was draped and prepped in the usual sterile manner and placed in a dorsal supine position with a leftward tilt. A low transverse skin incision was made with scalpel and carried down through the subcutaneous tissue to the fascia. Fascial incision was made and extended transversely. The fascia was separated from the underlying rectus tissue superiorly and inferiorly. The rectus muscles were separated in the midline bluntly and the peritoneum was entered bluntly. An Alexis retractor was placed to aid in visualization of the uterus. A  bladder flap was not developed. A low transverse uterine incision was made directly above uterine window. The  infant was successfully delivered from cephalic presentation, the umbilical cord was clamped after 1 minute. Cord ph was not sent, and cord blood was obtained for evaluation. The placenta was removed Intact and appeared normal. The uterine incision was closed with a single layer running locked suture of 0-Monocryl. Overall, excellent hemostasis was noted.  Attention was then turned to the fallopian tubes. The left Fallopian tube was identified and then traced to it's fimbriae. Using the Ligasure device and taking care to avoid large vascular structures, the left fallopian tube was removed sequentially from the fimbriae to the cornua, with excellent hemostasis noted. Attention was then turned to the right fallopian tube, and after confirmation of identification by tracing the tube out to the fimbriae, the same procedure was then performed on with excellent hemostasis noted.  The abdomen and pelvis were cleared of all clot and debris and the Jon Gills was removed. Hemostasis was confirmed on all surfaces.  The peritoneum was reapproximated using 2-0 vicryl . The fascia was then closed using 0 Vicryl in a running fashion. The subcutaneous layer was not reapproximated. The skin was closed with a 4-0 vicryl subcuticular stitch. The patient tolerated the procedure well. Sponge, lap, instrument and needle counts were correct x 2. She was taken to the recovery room in stable condition.  Disposition: PACU - hemodynamically stable.    Signed: Joanne Gavel, MD OB Fellow, [facility] for [facility], [facility]

## 2023-09-12 NOTE — Anesthesia Procedure Notes (Signed)
 Spinal  Patient location during procedure: OR Start time: 09/12/2023 8:06 AM End time: 09/12/2023 8:08 AM Reason for block: surgical anesthesia Staffing Performed: anesthesiologist  Anesthesiologist: Linton Rump, MD Performed by: Linton Rump, MD Authorized by: Linton Rump, MD   Preanesthetic Checklist Completed: patient identified, IV checked, site marked, risks and benefits discussed, surgical consent, monitors and equipment checked, pre-op evaluation and timeout performed Spinal Block Patient position: sitting Prep: DuraPrep Patient monitoring: blood pressure and continuous pulse ox Approach: midline Location: L3-4 Injection technique: single-shot Needle Needle type: Pencan  Needle gauge: 24 G Needle length: 9 cm Assessment Sensory level: T4 Additional Notes Risks and benefits of neuraxial anesthesia including, but not limited to, infection, bleeding, local anesthetic toxicity, headache, hypotension, back pain, block failure, etc. were discussed with the patient. The patient expressed understanding and consented to the procedure. I confirmed that the patient has no bleeding disorders and is not taking blood thinners. I confirmed the patient's last platelet count with the nurse. Monitors were applied. A time-out was performed immediately prior to the procedure. Sterile technique was used throughout the whole procedure.   1 attempt(s)

## 2023-09-12 NOTE — Anesthesia Postprocedure Evaluation (Signed)
 Anesthesia Post Note  Patient: JANNAE FAGERSTROM  Procedure(s) Performed: CESAREAN SECTION, WITH BILATERAL TUBAL LIGATION (Bilateral)     Patient location during evaluation: PACU Anesthesia Type: Spinal Level of consciousness: awake Pain management: pain level controlled Vital Signs Assessment: post-procedure vital signs reviewed and stable Respiratory status: spontaneous breathing, respiratory function stable and nonlabored ventilation Cardiovascular status: blood pressure returned to baseline and stable Postop Assessment: no headache, no backache and no apparent nausea or vomiting Anesthetic complications: no   No notable events documented.  Last Vitals:  Vitals:   09/12/23 1020 09/12/23 1025  BP:  (!) 106/58  Pulse:  71  Resp:  (!) 9  Temp: (!) 36.3 C   SpO2:  100%    Last Pain:  Vitals:   09/12/23 1025  TempSrc:   PainSc: 2    Pain Goal:    LLE Motor Response: Purposeful movement (09/12/23 1010) LLE Sensation: Numbness, Tingling (09/12/23 1010) RLE Motor Response: Purposeful movement (09/12/23 1010) RLE Sensation: Tingling, Numbness (09/12/23 1010)     Epidural/Spinal Function Cutaneous sensation: Tingles (09/12/23 1010), Patient able to flex knees: No (09/12/23 1010), Patient able to lift hips off bed: No (09/12/23 1010), Back pain beyond tenderness at insertion site: No (09/12/23 1010), Progressively worsening motor and/or sensory loss: No (09/12/23 1010), Bowel and/or bladder incontinence post epidural: No (09/12/23 1010)  Linton Rump

## 2023-09-12 NOTE — Discharge Summary (Signed)
 Postpartum Discharge Summary  Date of Service updated***     Patient Name: Beverly Leon DOB: 1986-06-11 MRN: [id number]  Date of admission: 09/12/2023 Delivery date:09/12/2023 Delivering provider: Lazaro Arms Date of discharge: 09/12/2023  Admitting diagnosis: Pregnancy [Z34.90] Intrauterine pregnancy: [redacted]w[redacted]d     Secondary diagnosis:  Principal Problem:   Status post repeat low transverse cesarean section Active Problems:   Encounter for supervision of normal pregnancy, antepartum   Late prenatal care in second trimester   History of heroin use   Smoker   Rh negative state in antepartum period   AMA (advanced maternal age) multigravida 35+  Additional problems: ***    Discharge diagnosis: Term Pregnancy Delivered                                              Post partum procedures:*** Augmentation: N/A Complications: None  Hospital course: 38 y.o. yo G4P4005 at [redacted]w[redacted]d was admitted to the hospital 09/12/2023 for scheduled cesarean section with the following indication:  prior cesarean delivery x3, desired sterilization .Delivery details are as follows:  Membrane Rupture Time/Date: 8:34 AM,09/12/2023  Delivery Method:C-Section, Low Transverse Operative Delivery:N/A Details of operation can be found in separate operative note.  Patient had a postpartum course complicated by***.  She is ambulating, tolerating a regular diet, passing flatus, and urinating well. Patient is discharged home in stable condition on  09/12/23        Newborn Data: Birth date:09/12/2023 Birth time:8:34 AM Gender:Female Living status:Living Apgars:9 ,9  Weight:3460 g    Magnesium Sulfate received: No BMZ received: No Rhophylac:{Rhophylac received:30440032} MMR:No T-DaP: Declined Flu: No RSV Vaccine received: No Transfusion:{Transfusion received:30440034}  Immunizations received: Immunization History  Administered Date(s) Administered   DTP 06/14/1990, 02/19/1991   Hep B, Unspecified  04/09/1997, 05/13/1997, 09/12/1997   MMR 06/14/1990, 02/19/1991   OPV 02/19/1991   Rho (D) Immune Globulin 07/19/2023    Physical exam  Vitals:   09/12/23 0925  BP: (!) 108/59  Pulse: 84  Resp: 20  SpO2: 97%   General: {Exam; general:21111117} Lochia: {Desc; appropriate/inappropriate:30686::"appropriate"} Uterine Fundus: {Desc; firm/soft:30687} Incision: {Exam; incision:21111123} DVT Evaluation: {Exam; dvt:2111122} Labs: Lab Results  Component Value Date   WBC 10.8 (H) 09/11/2023   HGB 11.4 (L) 09/11/2023   HCT 33.4 (L) 09/11/2023   MCV 96.8 09/11/2023   PLT 227 09/11/2023      Latest Ref Rng & Units 11/01/2022   11:05 AM  CMP  Glucose 70 - 99 mg/dL 86   BUN 6 - 20 mg/dL 14   Creatinine 4.09 - 1.00 mg/dL 8.11   Sodium 914 - 782 mmol/L 139   Potassium 3.5 - 5.2 mmol/L 4.8   Chloride 96 - 106 mmol/L 103   CO2 20 - 29 mmol/L 22   Calcium 8.7 - 10.2 mg/dL 9.3   Total Protein 6.0 - 8.5 g/dL 6.6   Total Bilirubin 0.0 - 1.2 mg/dL 0.3   Alkaline Phos 44 - 121 IU/L 35   AST 0 - 40 IU/L 17   ALT 0 - 32 IU/L 13    Edinburgh Score:     No data to display         No data recorded  After visit meds:  Allergies as of 09/12/2023   No Known Allergies   Med Rec must be completed prior to using this [facility]***  Discharge home in stable condition Infant Feeding: {Baby feeding:23562} Infant Disposition:{CHL IP OB HOME WITH UVOZDG:64403} Discharge instruction: per After Visit Summary and Postpartum booklet. Activity: Advance as tolerated. Pelvic rest for 6 weeks.  Diet: {OB KVQQ:59563875} Future Appointments: Future Appointments  Date Time Provider Department Center  09/20/2023 11:10 AM Cheral Marker, CNM [facility]  10/17/2023  1:30 PM Cheral Marker, CNM [facility]   Follow up Visit:  Message sent to [facility] 3/11  Please schedule this patient for a In person postpartum visit in 6 weeks with the following provider: Any  provider. Additional Postpartum F/U:Incision check 1 week  Low risk pregnancy complicated by:  hx OUD Delivery mode:  C-Section, Low Transverse Anticipated Birth Control:   BTS   09/12/2023 Joanne Gavel, MD

## 2023-09-13 LAB — CBC
HCT: 23.7 % — ABNORMAL LOW (ref 36.0–46.0)
Hemoglobin: 8.2 g/dL — ABNORMAL LOW (ref 12.0–15.0)
MCH: 33.7 pg (ref 26.0–34.0)
MCHC: 34.6 g/dL (ref 30.0–36.0)
MCV: 97.5 fL (ref 80.0–100.0)
Platelets: 196 10*3/uL (ref 150–400)
RBC: 2.43 MIL/uL — ABNORMAL LOW (ref 3.87–5.11)
RDW: 12.9 % (ref 11.5–15.5)
WBC: 16.8 10*3/uL — ABNORMAL HIGH (ref 4.0–10.5)
nRBC: 0 % (ref 0.0–0.2)

## 2023-09-13 MED ORDER — FERROUS SULFATE 325 (65 FE) MG PO TABS
325.0000 mg | ORAL_TABLET | ORAL | Status: DC
  Administered 2023-09-13: 325 mg via ORAL
  Filled 2023-09-13: qty 1

## 2023-09-13 NOTE — Lactation Note (Signed)
 This note was copied from a baby's chart. Lactation Consultation Note  Patient Name: Beverly Leon Date: 09/13/2023 Age:38 hours Reason for consult: Follow-up assessment;1st time breastfeeding;Term;Infant weight loss;Nipple pain/trauma;Breastfeeding assistance  P4- Infant has had a weight loss of 7.51%. MOB reports that infant has been nursing well, but seems to be hungry after every breastfeeding today. LC reviewed what day 2 cluster feeding looks like and how this is normal. MOB unlatched infant from the breast because she wanted LC to hand express colostrum so she knows it is there. LC was able to express multiple large drops of colostrum bilaterally. LC praised MOB and encouraged her to continue offering the breast, then supplement with formula after as she has been. LC then assisted with placing infant back on the breast. Infant was placed in the cross cradle hold on the left breast. Infant latched immediately with flanged lips. LC did not that infant seemed to mostly chomp vs nutritive suck at this time. LC provided MOB with coconut oil and encouraged her to use it after every feeding. LC encouraged MOB to call for further assistance as needed.  Maternal Data Has patient been taught Hand Expression?: Yes Does the patient have breastfeeding experience prior to this delivery?: No  Feeding Mother's Current Feeding Choice: Breast Milk and Formula Nipple Type: Slow - flow  LATCH Score Latch: Repeated attempts needed to sustain latch, nipple held in mouth throughout feeding, stimulation needed to elicit sucking reflex.  Audible Swallowing: A few with stimulation  Type of Nipple: Everted at rest and after stimulation  Comfort (Breast/Nipple): Soft / non-tender  Hold (Positioning): Assistance needed to correctly position infant at breast and maintain latch.  LATCH Score: 7   Lactation Tools Discussed/Used Pump Education: Milk Storage  Interventions Interventions: Breast  feeding basics reviewed;Assisted with latch;Hand express;Breast compression;Adjust position;Support pillows;Position options;Coconut oil;Education;LC Services brochure  Discharge Discharge Education: Engorgement and breast care;Warning signs for feeding baby Pump: DEBP;Personal;Advised to call insurance company  Consult Status Consult Status: Follow-up Date: 09/14/23 Follow-up type: In-patient    Dema Severin BS, IBCLC 09/13/2023, 9:45 PM

## 2023-09-13 NOTE — Progress Notes (Signed)
 POSTPARTUM PROGRESS NOTE  POD #1  Subjective:  Beverly Leon is a 38 y.o. G4P4005 s/p rLTCS at [redacted]w[redacted]d.  No acute events overnight. She reports she is doing well. She denies any problems with ambulating, voiding or po intake. Denies nausea or vomiting. She has not yet passed flatus. Pain is well controlled.  Lochia is like a period.  Objective: BP 126/71 (BP Location: Right Arm)   Pulse 85   Temp 98.3 F (36.8 C) (Oral)   Resp 18   SpO2 98%   Breastfeeding Unknown   Physical Exam:  General: alert, cooperative and no distress Chest: no respiratory distress Heart:regular rate, distal pulses intact Abdomen: soft, nontender,  Uterine Fundus: firm, appropriately tender DVT Evaluation: No calf swelling or tenderness Extremities: no LE edema Skin: warm, dry; incision clean/dry/intact w/ honeycomb dressing in place  Recent Labs    09/11/23 0930 09/13/23 0450  HGB 11.4* 8.2*  HCT 33.4* 23.7*    Assessment/Plan: Beverly Leon is a 38 y.o. G4P4005 s/p rLTCS and BTS at [redacted]w[redacted]d.  POD#1 - Doing welll; pain well controlled. H/H appropriate  Routine postpartum care  OOB, ambulated  Lovenox for VTE prophylaxis Anemia: asymptomatic, start PO iron Contraception: bilateral salpingectomy at time of cesarean Feeding: breast and bottle Rh neg: ordered for rhogam Dispo: Plan for discharge POD#2.   LOS: 1 day   Venora Maples, MD/MPH Attending Family Medicine Physician, [facility] for [facility], [facility]  09/13/2023, 10:20 AM

## 2023-09-14 LAB — RH IG WORKUP (INCLUDES ABO/RH)
Fetal Screen: NEGATIVE
Gestational Age(Wks): 39.5
Unit division: 0

## 2023-09-14 LAB — SURGICAL PATHOLOGY

## 2023-09-14 MED ORDER — IBUPROFEN 800 MG PO TABS: 800.0000 mg | ORAL_TABLET | Freq: Three times a day (TID) | ORAL | 0 refills | Status: AC

## 2023-09-14 MED ORDER — ACETAMINOPHEN 500 MG PO TABS: 1000.0000 mg | ORAL_TABLET | Freq: Three times a day (TID) | ORAL | 0 refills | Status: AC

## 2023-09-14 NOTE — Lactation Note (Signed)
 This note was copied from a baby's chart. Lactation Consultation Note  Patient Name: Beverly Leon [id number] Date: 09/14/2023 Age:38 hours Reason for consult: Follow-up assessment;Maternal discharge;Term;1st time breastfeeding  P5, first time breastfeeding mom reports that feedings are going well except she is supplementing with formula to meet infant's demands. LC reassured mom of doing a great job and to continue to offer the breast first prior to formula supplementation. Mom plans to continue to supplement while milk is transitioning. LC reviewed milk production rates and normalcy over the next few days including engorgement treatment. Discussed outpatient lactation services and recommended scheduling an appointment after discharge. Provided encouragement to mom as this is her first time breastfeeding. Ensured that if she can progress through the first few weeks of breastfeeding, it gets better. Reminded her of lactation support and provided milk collection bottles and a manual pump for home use. Wished family well.   Maternal Data    Feeding Mother's Current Feeding Choice: Breast Milk and Formula Nipple Type: Slow - flow  LATCH Score   Infant asleep in mother's arms at [facility] visit.  Interventions Interventions: Breast feeding basics reviewed;Education;CDC Guidelines for Breast Pump Cleaning;CDC milk storage guidelines;LC Services brochure  Discharge Discharge Education: Engorgement and breast care;Outpatient recommendation Pump: DEBP;Personal;Advised to call insurance company;Manual (provided a manual pump for home use; has personal pump but wants another per previous LC)  Consult Status Consult Status: Complete Date: 09/14/23    Su Grand 09/14/2023, 10:38 AM

## 2023-09-18 ENCOUNTER — Encounter: Payer: Self-pay | Admitting: Women's Health

## 2023-09-20 ENCOUNTER — Ambulatory Visit: Admitting: Women's Health

## 2023-09-20 ENCOUNTER — Encounter: Payer: Self-pay | Admitting: Women's Health

## 2023-09-20 VITALS — BP 122/86 | HR 88 | Ht 66.0 in | Wt 171.4 lb

## 2023-09-20 DIAGNOSIS — Z4889 Encounter for other specified surgical aftercare: Secondary | ICD-10-CM

## 2023-09-20 NOTE — Patient Instructions (Signed)
 Tips To Increase Milk Supply Lots of water! Enough so that your urine is clear Plenty of calories, if you're not getting enough calories, your milk supply can decrease Breastfeed/pump often, every 2-3 hours x 20-33mins Fenugreek 3 pills 3 times a day, this may make your urine smell like maple syrup Mother's Milk Tea Lactation cookies, google for the recipe Real oatmeal Body Armor sports drinks Liquid Gold Greater Than hydration drink

## 2023-09-20 NOTE — Progress Notes (Signed)
   GYN VISIT Patient name: Beverly Leon MRN [id number]  Date of birth: [birth year] Chief Complaint:   Post-op Follow-up (1 wk incision check)  History of Present Illness:   Beverly Leon is a 38 y.o. G13P4005 Caucasian female 8d s/p RCS w/ BTS being seen today for incision check.  Breast & bottlefeeding. No ppd/anxiety.  No LMP recorded.     06/22/2023    9:23 AM 03/28/2023   10:06 AM 10/19/2022    9:31 AM 03/08/2018    9:31 AM  Depression screen PHQ 2/9  Decreased Interest 1 1 0 0  Down, Depressed, Hopeless 0 0 0 0  PHQ - 2 Score 1 1 0 0  Altered sleeping 0 2 0   Tired, decreased energy 2 2 1    Change in appetite 0 0 0   Feeling bad or failure about yourself  0 0 1   Trouble concentrating 1 2 1    Moving slowly or fidgety/restless 0 0 0   Suicidal thoughts 0 0 0   PHQ-9 Score 4 7 3          03/28/2023   10:07 AM 10/19/2022    9:32 AM  GAD 7 : Generalized Anxiety Score  Nervous, Anxious, on Edge 0 0  Control/stop worrying 0 0  Worry too much - different things 0 0  Trouble relaxing 0 0  Restless 1 0  Easily annoyed or irritable 1 1  Afraid - awful might happen 0 0  Total GAD 7 Score 2 1     Review of Systems:   Pertinent items are noted in HPI Denies fever/chills, dizziness, headaches, visual disturbances, fatigue, shortness of breath, chest pain, abdominal pain, vomiting, abnormal vaginal discharge/itching/odor/irritation, problems with periods, bowel movements, urination, or intercourse unless otherwise stated above.  Pertinent History Reviewed:  Reviewed past medical,surgical, social, obstetrical and family history.  Reviewed problem list, medications and allergies. Physical Assessment:   Vitals:   09/20/23 1115  BP: 122/86  Pulse: 88  Weight: 171 lb 6.4 oz (77.7 kg)  Height: 5\' 6"  (1.676 m)  Body mass index is 27.66 kg/m.       Physical Examination:   General appearance: alert, well appearing, and in no distress  Mental status: alert, oriented to person,  place, and time  Skin: warm & dry   Cardiovascular: normal heart rate noted  Respiratory: normal respiratory effort, no distress  Abdomen: soft, non-tender, honeycomb removed, small amt blood on honeycomb. No active bleeding, no s/s infection  Pelvic: examination not indicated  Extremities: no edema   Chaperone: N/A  No results found for this or any previous visit (from the past 24 hours).  Assessment & Plan:  1) 8d s/p RCS w/ BTS> breast and bottlefeeding, milk tips given  2) Incision check> healing well  Meds: No orders of the defined types were placed in this encounter.   No orders of the defined types were placed in this encounter.   Return for As scheduled.  Cheral Marker CNM, [facility] 09/20/2023 11:37 AM

## 2023-09-29 MED FILL — Oxytocin Inj 10 Unit/ML: INTRAMUSCULAR | Qty: 3 | Status: AC

## 2023-10-02 ENCOUNTER — Other Ambulatory Visit (HOSPITAL_COMMUNITY): Payer: Medicaid Other

## 2023-10-03 ENCOUNTER — Ambulatory Visit: Admitting: Obstetrics & Gynecology

## 2023-10-03 VITALS — BP 131/83 | HR 66

## 2023-10-03 DIAGNOSIS — Z9889 Other specified postprocedural states: Secondary | ICD-10-CM

## 2023-10-03 NOTE — Progress Notes (Signed)
  HPI: Patient returns for routine postoperative follow-up having undergone repeat C section + bilateral salpingectomy on 09/12/23.  The patient's immediate postoperative recovery has been unremarkable. Since hospital discharge the patient reports incision red and drainy.   Current Outpatient Medications: ferrous sulfate 325 (65 FE) MG tablet, Take 1 tablet (325 mg total) by mouth every other day., Disp: 45 tablet, Rfl: 2 Prenatal Vit-Fe Fumarate-FA (PRENATAL VITAMIN PO), Take 1 tablet by mouth daily., Disp: , Rfl:  acetaminophen (TYLENOL) 500 MG tablet, Take 2 tablets (1,000 mg total) by mouth every 8 (eight) hours. (Patient not taking: Reported on 10/03/2023), Disp: 30 tablet, Rfl: 0 docusate sodium (COLACE) 50 MG capsule, Take 50 mg by mouth 2 (two) times daily. (Patient not taking: Reported on 10/03/2023), Disp: , Rfl:  ibuprofen (ADVIL) 800 MG tablet, Take 1 tablet (800 mg total) by mouth every 8 (eight) hours. (Patient not taking: Reported on 10/03/2023), Disp: 30 tablet, Rfl: 0  No current facility-administered medications for this visit.    Blood pressure (!) 141/93, pulse 67, currently breastfeeding.  Physical Exam: + yeast on her incision/moisture changes Gentian violet applied  Diagnostic Tests:   Pathology:   Impression + Management plan:   ICD-10-CM   1. Post-operative state  Z98.890           Medications Prescribed this encounter: No orders of the defined types were placed in this encounter.     Follow up: No follow-ups on file.    Lazaro Arms, MD Attending Physician for the Center for [facility] and [facility] 10/03/2023 3:32 PM

## 2023-10-17 ENCOUNTER — Ambulatory Visit: Admitting: Women's Health

## 2023-10-17 ENCOUNTER — Encounter: Payer: Self-pay | Admitting: Women's Health

## 2023-10-17 DIAGNOSIS — Z1332 Encounter for screening for maternal depression: Secondary | ICD-10-CM

## 2023-10-17 DIAGNOSIS — O165 Unspecified maternal hypertension, complicating the puerperium: Secondary | ICD-10-CM | POA: Diagnosis not present

## 2023-10-17 DIAGNOSIS — Z98891 History of uterine scar from previous surgery: Secondary | ICD-10-CM

## 2023-10-17 DIAGNOSIS — Z9079 Acquired absence of other genital organ(s): Secondary | ICD-10-CM | POA: Insufficient documentation

## 2023-10-17 MED ORDER — AMLODIPINE BESYLATE 5 MG PO TABS: 5.0000 mg | ORAL_TABLET | Freq: Every day | ORAL | 0 refills | Status: DC

## 2023-10-17 NOTE — Progress Notes (Signed)
 POSTPARTUM VISIT Patient name: Beverly Leon MRN [id number]  Date of birth: 30-Sep-1985 Chief Complaint:   Postpartum Care (C-section)  History of Present Illness:   Beverly Leon is a 38 y.o. G46P4005 Caucasian female being seen today for a postpartum visit. She is 5 weeks postpartum following a repeat cesarean section, low transverse incision w/ BTS at 39.5 gestational weeks. IOL: no, for n/a. Anesthesia: spinal.  Laceration: n/a.  Complications: none. Inpatient contraception: yes BTS .   Pregnancy complicated by SUA . Tobacco use: yes. Substance use disorder: h/o substance abuse, clean x 60yrs Last pap smear: 10/19/22 and results were NILM w/ HRHPV negative. Next pap smear due: 2027 No LMP recorded.  Postpartum course has been uncomplicated. Bleeding none. Bowel function is normal. Bladder function is normal. Urinary incontinence? no, fecal incontinence? no Patient is not sexually active. Last sexual activity: prior to birth of baby. Desired contraception: BTS done. Patient does not want a pregnancy in the future.  Desired family size is 5 children.   Upstream - 10/17/23 1340       Pregnancy Intention Screening   Does the patient want to become pregnant in the next year? No    Does the patient's partner want to become pregnant in the next year? No    Would the patient like to discuss contraceptive options today? No      Contraception Wrap Up   Current Method Female Sterilization    End Method Female Sterilization    Contraception Counseling Provided No            The pregnancy intention screening data noted above was reviewed. Potential methods of contraception were discussed. The patient elected to proceed with Female Sterilization.  Edinburgh Postpartum Depression Screening: negative  Edinburgh Postnatal Depression Scale - 10/17/23 1335       Edinburgh Postnatal Depression Scale:  In the Past 7 Days   I have been able to laugh and see the funny side of things. 0    I  have looked forward with enjoyment to things. 0    I have blamed myself unnecessarily when things went wrong. 0    I have been anxious or worried for no good reason. 1    I have felt scared or panicky for no good reason. 0    Things have been getting on top of me. 0    I have been so unhappy that I have had difficulty sleeping. 0    I have felt sad or miserable. 0    I have been so unhappy that I have been crying. 0    The thought of harming myself has occurred to me. 0    Edinburgh Postnatal Depression Scale Total 1                03/28/2023   10:07 AM 10/19/2022    9:32 AM  GAD 7 : Generalized Anxiety Score  Nervous, Anxious, on Edge 0 0  Control/stop worrying 0 0  Worry too much - different things 0 0  Trouble relaxing 0 0  Restless 1 0  Easily annoyed or irritable 1 1  Afraid - awful might happen 0 0  Total GAD 7 Score 2 1     Baby's course has been uncomplicated. Baby is feeding by bottle. Infant has a pediatrician/family doctor? Yes.  Childcare strategy if returning to work/school: family.  Pt has material needs met for her and baby: Yes.   Review of Systems:   Pertinent  items are noted in HPI Denies Abnormal vaginal discharge w/ itching/odor/irritation, headaches, visual changes, shortness of breath, chest pain, abdominal pain, severe nausea/vomiting, or problems with urination or bowel movements. Pertinent History Reviewed:  Reviewed past medical,surgical, obstetrical and family history.  Reviewed problem list, medications and allergies. OB History  Gravida Para Term Preterm AB Living  4 4 4   5   SAB IAB Ectopic Multiple Live Births     1 5    # Outcome Date GA Lbr Len/2nd Weight Sex Type Anes PTL Lv  4 Term 09/12/23 [redacted]w[redacted]d  7 lb 10.1 oz (3.46 kg) M CS-LTranv Spinal  LIV  3 Term 08/16/10 [redacted]w[redacted]d  7 lb 4 oz (3.289 kg) F CS-LTranv Spinal N LIV  2 Term 06/11/04 [redacted]w[redacted]d  6 lb 8 oz (2.948 kg) F CS-LTranv Spinal N LIV  1A Term 07/08/03 [redacted]w[redacted]d  5 lb 6 oz (2.438 kg) F  CS-LTranv Spinal N LIV  1B Term 07/08/03 [redacted]w[redacted]d  6 lb 3 oz (2.807 kg) F CS-LTranv Spinal N LIV   Physical Assessment:   Vitals:   10/17/23 1335 10/17/23 1350  BP: (!) 127/91 (!) 134/91  Pulse: 88 80  Weight: 164 lb 6.4 oz (74.6 kg)   Height: 5\' 5"  (1.651 m)   Body mass index is 27.36 kg/m.       Physical Examination:   General appearance: alert, well appearing, and in no distress  Mental status: alert, oriented to person, place, and time  Skin: warm & dry   Cardiovascular: normal heart rate noted   Respiratory: normal respiratory effort, no distress   Breasts: deferred, no complaints   Abdomen: soft, non-tender, demarcated erythema around incision c/w yeast- painted w/ gentian violet, ~2cm area Lt side incision w/ 3 visible sutures- area open. Sutures clipped  Pelvic: examination not indicated. Thin prep pap obtained: No  Rectal: not examined  Extremities: Edema: none   Chaperone: N/A       No results found for this or any previous visit (from the past 24 hours).  Assessment & Plan:  1) Postpartum exam 2) 5 wks s/p repeat cesarean section, low transverse incision w/ BTS 3) bottle feeding 4) Depression screening 5) Contraception s/p BTS 6) Incisional candida> painted w/ gentian violet 7) Open area Lt side incision> 3 visible sutures clipped, keep clean and dry 8) PPHTN> bp last visit 141/93, elevated again today. Rx norvasc 5mg , f/u 1wk for bp check, take meds at least 2hr before appt  Essential components of care per ACOG recommendations:  1.  Mood and well being:  If positive depression screen, discussed and plan developed.  If using tobacco we discussed reduction/cessation and risk of relapse If current substance abuse, we discussed and referral to local resources was offered.   2. Infant care and feeding:  If breastfeeding, discussed returning to work, pumping, breastfeeding-associated pain, guidance regarding return to fertility while lactating if not using another  method. If needed, patient was provided with a letter to be allowed to pump q 2-3hrs to support lactation in a private location with access to a refrigerator to store breastmilk.   Recommended that all caregivers be immunized for flu, pertussis and other preventable communicable diseases If pt does not have material needs met for her/baby, referred to local resources for help obtaining these.  3. Sexuality, contraception and birth spacing Provided guidance regarding sexuality, management of dyspareunia, and resumption of intercourse Discussed avoiding interpregnancy interval <67mths and recommended birth spacing of 18 months  4. Sleep and  fatigue Discussed coping options for fatigue and sleep disruption Encouraged family/partner/community support of 4 hrs of uninterrupted sleep to help with mood and fatigue  5. Physical recovery  If pt had a C/S, assessed incisional pain and providing guidance on normal vs prolonged recovery If pt had a laceration, perineal healing and pain reviewed.  If urinary or fecal incontinence, discussed management and referred to PT or uro/gyn if indicated  Patient is safe to resume physical activity. Discussed attainment of healthy weight.  6.  Chronic disease management Discussed pregnancy complications if any, and their implications for future childbearing and long-term maternal health. Review recommendations for prevention of recurrent pregnancy complications, such as 17 hydroxyprogesterone caproate to reduce risk for recurrent PTB not applicable, or aspirin to reduce risk of preeclampsia not applicable. Pt had GDM: no. If yes, 2hr GTT scheduled: not applicable. Reviewed medications and non-pregnant dosing including consideration of whether pt is breastfeeding using a reliable resource such as LactMed: not applicable Referred for f/u w/ PCP or subspecialist providers as indicated: not applicable  7. Health maintenance Mammogram at 38yo or earlier if  indicated Pap smears as indicated  Meds:  Meds ordered this encounter  Medications   amLODipine (NORVASC) 5 MG tablet    Sig: Take 1 tablet (5 mg total) by mouth daily.    Dispense:  30 tablet    Refill:  0    Follow-up: Return in about 1 week (around 10/24/2023) for bp and incision check w/ provider.   No orders of the defined types were placed in this encounter.   Ferd Householder CNM, [facility] 10/17/2023 2:00 PM

## 2023-10-25 ENCOUNTER — Encounter: Admitting: Women's Health

## 2023-11-14 ENCOUNTER — Encounter: Payer: Self-pay | Admitting: Women's Health

## 2023-11-14 ENCOUNTER — Other Ambulatory Visit: Payer: Self-pay | Admitting: Women's Health

## 2024-01-18 ENCOUNTER — Encounter: Payer: Self-pay | Admitting: Women's Health

## 2024-01-18 ENCOUNTER — Ambulatory Visit: Admitting: Women's Health

## 2024-01-18 VITALS — BP 123/86 | HR 105 | Ht 66.0 in | Wt 164.0 lb

## 2024-01-18 DIAGNOSIS — F53 Postpartum depression: Secondary | ICD-10-CM | POA: Diagnosis not present

## 2024-01-18 NOTE — Progress Notes (Signed)
 GYN VISIT Patient name: Beverly Leon MRN [id number]  Date of birth: 1986/05/20 Chief Complaint:   postpartum depression  History of Present Illness:   Beverly Leon is a 38 y.o. G43P4005 Caucasian female postpartum being seen today for postpartum depression that started about 2 months ago. Cries a lot, denies SI/HI/II, eats/sleeps well, has good support at home. Doesn't feel like laughing, doesn't find joy in things she used to. EPDS today 17 (was 1 on 4/15 at pp visit), PHQ9 13/GAD7 8. Declines meds today, wants IBH referral.  Patient's last menstrual period was 12/22/2023. The current method of family planning is tubal ligation.  Last pap 10/19/22. Results were: NILM w/ HRHPV negative     01/18/2024   10:33 AM 06/22/2023    9:23 AM 03/28/2023   10:06 AM 10/19/2022    9:31 AM 03/08/2018    9:31 AM  Depression screen PHQ 2/9  Decreased Interest 2 1 1  0 0  Down, Depressed, Hopeless 1 0 0 0 0  PHQ - 2 Score 3 1 1  0 0  Altered sleeping 0 0 2 0   Tired, decreased energy 3 2 2 1    Change in appetite 1 0 0 0   Feeling bad or failure about yourself  1 0 0 1   Trouble concentrating 3 1 2 1    Moving slowly or fidgety/restless 2 0 0 0   Suicidal thoughts 0 0 0 0   PHQ-9 Score 13 4 7 3    Difficult doing work/chores Somewhat difficult            01/18/2024   10:34 AM 03/28/2023   10:07 AM 10/19/2022    9:32 AM  GAD 7 : Generalized Anxiety Score  Nervous, Anxious, on Edge 1 0 0  Control/stop worrying 1 0 0  Worry too much - different things 2 0 0  Trouble relaxing 1 0 0  Restless 1 1 0  Easily annoyed or irritable 1 1 1   Afraid - awful might happen 1 0 0  Total GAD 7 Score 8 2 1   Anxiety Difficulty Somewhat difficult       Review of Systems:   Pertinent items are noted in HPI Denies fever/chills, dizziness, headaches, visual disturbances, fatigue, shortness of breath, chest pain, abdominal pain, vomiting, abnormal vaginal discharge/itching/odor/irritation, problems with  periods, bowel movements, urination, or intercourse unless otherwise stated above.  Pertinent History Reviewed:  Reviewed past medical,surgical, social, obstetrical and family history.  Reviewed problem list, medications and allergies. Physical Assessment:   Vitals:   01/18/24 1028  BP: 123/86  Pulse: (!) 105  Weight: 164 lb (74.4 kg)  Height: 5' 6 (1.676 m)  Body mass index is 26.47 kg/m.       Physical Examination:   General appearance: alert, well appearing, and in no distress  Mental status: alert, oriented to person, place, and time  Skin: warm & dry   Cardiovascular: normal heart rate noted  Respiratory: normal respiratory effort, no distress  Abdomen: soft, non-tender   Pelvic: examination not indicated  Extremities: no edema   Chaperone: N/A  No results found for this or any previous visit (from the past 24 hours).  Assessment & Plan:  1) postpartum  2) PPD> referral to IBH, declines meds right now.   Meds: No orders of the defined types were placed in this encounter.   Orders Placed This Encounter  Procedures   Amb ref to [facility]    Return for  as needed.  Suzen JONELLE Fetters CNM, [facility] 01/18/2024 10:58 AM

## 2024-02-02 ENCOUNTER — Telehealth: Payer: Self-pay | Admitting: Clinical

## 2024-02-02 NOTE — Telephone Encounter (Signed)
Attempt call regarding referral; Left HIPPA-compliant message to call back Mikle Sternberg from [facility] at  [phone] (Ashelyn Mccravy's office).    

## 2024-02-23 NOTE — BH Specialist Note (Signed)
 Integrated Behavioral Health via Telemedicine Visit  02/26/2024 [name] [id number]  Number of Integrated Behavioral Health Clinician visits: 1- Initial Visit  Session Start time: 1315   Session End time: 1352  Total time in minutes: 37  Referring Provider: Suzen Fetters, CNM Patient/Family location: Home [facility] Provider location: [facility]  All persons participating in visit: Patient Beverly Leon and Beverly Leon Jolyn Deshmukh   Types of Service: Individual psychotherapy and Video visit  I connected with [name] and/or [name] n/a via  Telephone or Video Enabled Telemedicine Application  (Video is Caregility application) and verified that I am speaking with the correct person using two identifiers. Discussed confidentiality: Yes   I discussed the limitations of telemedicine and the availability of in person appointments.  Discussed there is a possibility of technology failure and discussed alternative modes of communication if that failure occurs.  I discussed that engaging in this telemedicine visit, they consent to the provision of behavioral healthcare and the services will be billed under their insurance.  Patient and/or legal guardian expressed understanding and consented to Telemedicine visit: Yes   Presenting Concerns: Patient and/or family reports the following symptoms/concerns: First time experiencing increased symptoms of depression and anxiety postpartum, with daily fatigue and slowed movements; pt copes with good support at home.  Duration of problem: Postpartum; Severity of problem: moderate  Patient and/or Family's Strengths/Protective Factors: Social connections, Concrete supports in place (healthy food, safe environments, etc.), and Sense of purpose  Goals Addressed: Patient will:  Reduce symptoms of: anxiety and depression   Increase knowledge and/or ability of: healthy habits    Demonstrate ability to: Increase healthy adjustment to current life circumstances, Increase adequate support systems for patient/family, and Increase motivation to adhere to plan of care  Progress towards Goals: Ongoing    Interventions: Interventions utilized:  Motivational Interviewing, Psychoeducation and/or Health Education, Link to [employer], and Supportive Reflection Standardized Assessments completed: GAD-7 and PHQ 9    Patient and/or Family Response: Patient agrees with treatment plan.   Clinical Assessment/Diagnosis  Adjustment disorder with mixed anxiety and depressed mood .   Patient may benefit from psychoeducation and brief therapeutic interventions regarding coping with symptoms of depression and anxiety.  Plan: Follow up with behavioral health clinician on : Two weeks Behavioral recommendations:  -Continue prioritizing healthy self-care (regular meals, adequate rest; allowing practical help from supportive friends and family; playing pool as able)  -Consider new mom support group as needed at either [email or web address] or [email or web address]  -Consider going with daughter to gym at least once weekly for the next two weeks (consider using massage chair only first visit, to begin habit of going) -Consider outdoor walks on good weather days Referral(s): Integrated Art gallery manager (In Clinic) and [employer]:  new mom support  I discussed the assessment and treatment plan with the patient and/or parent/guardian. They were provided an opportunity to ask questions and all were answered. They agreed with the plan and demonstrated an understanding of the instructions.   They were advised to call back or seek an in-person evaluation if the symptoms worsen or if the condition fails to improve as anticipated.  Warren BROCKS Esai Stecklein, LCSW     02/26/2024    1:30 PM 01/18/2024   10:33 AM 06/22/2023    9:23 AM 03/28/2023   10:06 AM 10/19/2022    9:31  AM  Depression screen PHQ 2/9  Decreased Interest 1 2 1 1  0  Down, Depressed, Hopeless 1 1 0 0 0  PHQ - 2 Score 2 3 1 1  0  Altered sleeping 0 0 0 2 0  Tired, decreased energy 3 3 2 2 1   Change in appetite 1 1 0 0 0  Feeling bad or failure about yourself  0 1 0 0 1  Trouble concentrating 1 3 1 2 1   Moving slowly or fidgety/restless 3 2 0 0 0  Suicidal thoughts 0 0 0 0 0  PHQ-9 Score 10 13 4 7 3   Difficult doing work/chores  Somewhat difficult         02/26/2024    1:33 PM 01/18/2024   10:34 AM 03/28/2023   10:07 AM 10/19/2022    9:32 AM  GAD 7 : Generalized Anxiety Score  Nervous, Anxious, on Edge 1 1 0 0  Control/stop worrying 1 1 0 0  Worry too much - different things 1 2 0 0  Trouble relaxing 1 1 0 0  Restless 0 1 1 0  Easily annoyed or irritable 1 1 1 1   Afraid - awful might happen 0 1 0 0  Total GAD 7 Score 5 8 2 1   Anxiety Difficulty  Somewhat difficult

## 2024-02-26 ENCOUNTER — Ambulatory Visit (INDEPENDENT_AMBULATORY_CARE_PROVIDER_SITE_OTHER): Payer: Self-pay | Admitting: Clinical

## 2024-02-26 DIAGNOSIS — F4323 Adjustment disorder with mixed anxiety and depressed mood: Secondary | ICD-10-CM | POA: Diagnosis not present

## 2024-02-26 NOTE — Patient Instructions (Signed)
 [facility] [address] [place], [place] [address] [phone] (main office) [phone] [facility] office)  New Parent Support Groups [email or web address]

## 2024-03-14 ENCOUNTER — Ambulatory Visit: Payer: Self-pay | Admitting: Clinical

## 2024-03-14 NOTE — BH Specialist Note (Signed)
 Pt did not arrive to video visit and did not answer the phone; Left HIPPA-compliant message to call back Warren from [employer] for [employer] at [facility] at  [phone] [facility] office).  ?; left MyChart message for patient.  ? ?
